# Patient Record
Sex: Female | Born: 1995 | Race: White | Hispanic: No | Marital: Single | Smoking: Never smoker
Health system: Southern US, Community
[De-identification: ages and names within clinical notes are randomized; demographics above are authoritative.]

## PROBLEM LIST (undated history)

## (undated) DIAGNOSIS — N949 Unspecified condition associated with female genital organs and menstrual cycle: Secondary | ICD-10-CM

## (undated) DIAGNOSIS — Z975 Presence of (intrauterine) contraceptive device: Secondary | ICD-10-CM

## (undated) DIAGNOSIS — M419 Scoliosis, unspecified: Secondary | ICD-10-CM

## (undated) DIAGNOSIS — N83209 Unspecified ovarian cyst, unspecified side: Secondary | ICD-10-CM

## (undated) HISTORY — PX: OTHER SURGICAL HISTORY: SHX169

## (undated) HISTORY — DX: Presence of (intrauterine) contraceptive device: Z97.5

## (undated) HISTORY — DX: Unspecified condition associated with female genital organs and menstrual cycle: N94.9

## (undated) HISTORY — DX: Unspecified ovarian cyst, unspecified side: N83.209

---
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
## 2013-03-14 ENCOUNTER — Encounter (HOSPITAL_COMMUNITY): Payer: Self-pay | Admitting: Emergency Medicine

## 2013-03-14 ENCOUNTER — Emergency Department (HOSPITAL_COMMUNITY)
Admission: EM | Admit: 2013-03-14 | Discharge: 2013-03-14 | Disposition: A | Discharge status: Home / Self Care | Payer: Medicaid Other | Attending: Emergency Medicine | Admitting: Emergency Medicine

## 2013-03-14 DIAGNOSIS — R1013 Epigastric pain: Secondary | ICD-10-CM | POA: Insufficient documentation

## 2013-03-14 DIAGNOSIS — Z8739 Personal history of other diseases of the musculoskeletal system and connective tissue: Secondary | ICD-10-CM | POA: Insufficient documentation

## 2013-03-14 DIAGNOSIS — K08109 Complete loss of teeth, unspecified cause, unspecified class: Secondary | ICD-10-CM | POA: Insufficient documentation

## 2013-03-14 DIAGNOSIS — R112 Nausea with vomiting, unspecified: Secondary | ICD-10-CM | POA: Insufficient documentation

## 2013-03-14 DIAGNOSIS — Z880 Allergy status to penicillin: Secondary | ICD-10-CM | POA: Insufficient documentation

## 2013-03-14 DIAGNOSIS — T40605A Adverse effect of unspecified narcotics, initial encounter: Secondary | ICD-10-CM | POA: Insufficient documentation

## 2013-03-14 DIAGNOSIS — K08409 Partial loss of teeth, unspecified cause, unspecified class: Secondary | ICD-10-CM

## 2013-03-14 DIAGNOSIS — R002 Palpitations: Secondary | ICD-10-CM | POA: Insufficient documentation

## 2013-03-14 DIAGNOSIS — R0602 Shortness of breath: Secondary | ICD-10-CM | POA: Insufficient documentation

## 2013-03-14 DIAGNOSIS — J029 Acute pharyngitis, unspecified: Secondary | ICD-10-CM | POA: Insufficient documentation

## 2013-03-14 LAB — HCG, SERUM, QUALITATIVE: Preg, Serum: NEGATIVE

## 2013-03-14 LAB — CBC WITH DIFFERENTIAL/PLATELET
Basophils Absolute: 0 10*3/uL (ref 0.0–0.1)
Basophils Relative: 0 % (ref 0–1)
Eosinophils Absolute: 0 10*3/uL (ref 0.0–0.7)
Eosinophils Relative: 0 % (ref 0–5)
MCH: 30.3 pg (ref 26.0–34.0)
MCHC: 34.1 g/dL (ref 30.0–36.0)
MCV: 88.8 fL (ref 78.0–100.0)
Monocytes Absolute: 0.4 10*3/uL (ref 0.1–1.0)
Platelets: 276 10*3/uL (ref 150–400)
RDW: 11.4 % — ABNORMAL LOW (ref 11.5–15.5)
WBC: 9 10*3/uL (ref 4.0–10.5)

## 2013-03-14 LAB — BASIC METABOLIC PANEL
Calcium: 9.9 mg/dL (ref 8.4–10.5)
Creatinine, Ser: 0.61 mg/dL (ref 0.50–1.10)
GFR calc non Af Amer: >90 mL/min (ref >90)
Sodium: 134 mEq/L — ABNORMAL LOW (ref 135–145)

## 2013-03-14 LAB — RAPID STREP SCREEN (MED CTR MEBANE ONLY): Streptococcus, Group A Screen (Direct): NEGATIVE

## 2013-03-14 MED ORDER — SODIUM CHLORIDE 0.9 % IV BOLUS (SEPSIS)
1000.0000 mL | Freq: Once | INTRAVENOUS | Status: AC
  Administered 2013-03-14: 1000 mL via INTRAVENOUS

## 2013-03-14 MED ORDER — ONDANSETRON HCL 4 MG/2ML IJ SOLN: 4.0000 mg | Freq: Once | INTRAMUSCULAR | Status: DC

## 2013-03-14 MED ORDER — ONDANSETRON 4 MG PO TBDP
ORAL_TABLET | ORAL | Status: AC
  Administered 2013-03-14: 4 mg via ORAL
  Filled 2013-03-14: qty 1

## 2013-03-14 MED ORDER — ONDANSETRON 8 MG PO TBDP: ORAL_TABLET | ORAL | Status: DC

## 2013-03-14 MED ORDER — ONDANSETRON 4 MG PO TBDP
4.0000 mg | ORAL_TABLET | Freq: Once | ORAL | Status: AC
  Administered 2013-03-14: 4 mg via ORAL

## 2013-03-14 NOTE — ED Provider Notes (Signed)
CSN: [id number]     Arrival date & time 03/14/13  1131 History   This chart was scribed for Geoffery Lyons, MD, by Yevette Edwards, ED Scribe. This patient was seen in room APA10/APA10 and the patient's care was started at 3:18 PM.  First MD Initiated Contact with Patient 03/14/13 1516     Chief Complaint  Patient presents with  . Emesis    The history is provided by the patient and a relative. No language interpreter was used.   HPI Comments: JADEYN HARGETT is a 17 y.o. female who presents to the Emergency Department complaining of acute, generalized abdominal pain which began yesterday along with the associated symptoms of nausea, emesis, a sore throat, and intermittent SOB and palpitations. The pt denies any diarrhea or otaligia. She was recently exposed to an individual who had strep throat. Two days ago, she had 4 wisdom teeth removed; the pt was prescribed hydrocodone and clindamycin following the operation. The pt states that she has experienced nausea after taking the hydrocodone.  She denies any abdominal surgeries. The pt has a h/o 12 surgeries for scoliosis. She is a non-smoker.   Past Medical History  Diagnosis Date  . Scoliosis    Past Surgical History  Procedure Laterality Date  . Back surgery for scoliosisi     History reviewed. No pertinent family history. History  Substance Use Topics  . Smoking status: Never Smoker   . Smokeless tobacco: Not on file  . Alcohol Use: No   No OB history provided.  Review of Systems  Constitutional: Positive for fever (Measured 99.6 F in the ED. ).  HENT: Positive for sore throat. Negative for ear pain.   Respiratory: Positive for shortness of breath.   Cardiovascular: Positive for palpitations.  Gastrointestinal: Positive for nausea, vomiting and abdominal pain. Negative for diarrhea.  All other systems reviewed and are negative.   Allergies  Morphine and related; Demerol; Fentanyl; and Penicillins  Home Medications    Current Outpatient Rx  Name  Route  Sig  Dispense  Refill  . sertraline (ZOLOFT) 100 MG tablet   Oral   Take 100 mg by mouth at bedtime.         . traZODone (DESYREL) 100 MG tablet   Oral   Take 100 mg by mouth at bedtime.          Triage Vitals: BP 113/73  Pulse 99  Temp(Src) 99.6 F (37.6 C) (Oral)  Resp 20  Ht 5\' 1"  (1.549 m)  Wt 95 lb (43.092 kg)  BMI 17.96 kg/m2  SpO2 100%  Physical Exam  Nursing note and vitals reviewed. Constitutional: She is oriented to person, place, and time. She appears well-developed and well-nourished. No distress.  HENT:  Head: Normocephalic and atraumatic.  The PO is mildly erythematous without exudate.  The extraction sites appear clean without bleeding or significant swelling.   Eyes: EOM are normal.  Neck: Neck supple. No tracheal deviation present.  Cardiovascular: Normal rate, regular rhythm and normal heart sounds.   No murmur heard. Pulmonary/Chest: Effort normal. No respiratory distress. She has no wheezes.  Abdominal: Soft. Bowel sounds are normal. She exhibits no distension. There is no tenderness. There is no rebound and no guarding.  Musculoskeletal: Normal range of motion.  Neurological: She is alert and oriented to person, place, and time.  Skin: Skin is warm and dry. No rash noted.  Psychiatric: She has a normal mood and affect. Her behavior is normal.    ED  Course  Procedures (including critical care time)  DIAGNOSTIC STUDIES: Oxygen Saturation is 100% on room air, normal by my interpretation.    COORDINATION OF CARE:  3:22 PM- Discussed treatment plan with patient, and the patient agreed to the plan.   5:46 PM- Rechecked pt. Informed pt of lab results.   Labs Review Labs Reviewed  CBC WITH DIFFERENTIAL - Abnormal; Notable for the following:    RDW 11.4 (*)    Neutrophils Relative % 80 (*)    All other components within normal limits  BASIC METABOLIC PANEL - Abnormal; Notable for the following:     Sodium 134 (*)    CO2 18 (*)    Glucose, Bld 54 (*)    All other components within normal limits  RAPID STREP SCREEN  CULTURE, GROUP A STREP  HCG, SERUM, QUALITATIVE   Imaging Review No results found.  EKG Interpretation   None       MDM  No diagnosis found. Patient is a 17 year old female who presents with complaints of nausea, vomiting, feeling shaky, and her throat hurting. She is 2 days status post wisdom tooth extraction. She was given IV fluids and nausea medication and appears more comfortable. Her laboratory studies are unremarkable. I am uncertain as to whether her symptoms are related to some sort of gastroenteritis or whether a side effect of the pain medication she was given. While in the ER, she experienced an episode where her heart was beating "forcefully". An EKG was performed and was unremarkable. She was monitored on a cardiac monitor for another hour and had no further episodes. At this point I feel as though she is stable for discharge. She is to experience further episodes she is to return to the emergency department.  I personally performed the services described in this documentation, which was scribed in my presence. The recorded information has been reviewed and is accurate.       Geoffery Lyons, MD 03/14/13 [phone]

## 2013-03-14 NOTE — ED Notes (Signed)
Had wisdom teeth removed 2 days ago. Felt sick yesterday, Today, vomiting and feels shakey, sore throat.

## 2013-03-14 NOTE — ED Notes (Signed)
Pt notified tech she felt like her heart was fluttering. EDP notified.

## 2013-03-14 NOTE — ED Notes (Signed)
Pt presents post surgical wisdom tooth extraction x 3 days ago. Pt c/o N/V past 2 days. No active vomiting noted at this time. Pt reports teeth/inscision site remains painful, and feels is nauseated due to the surgery. NAD noted at this time. No oral swelling or facial swelling noted.

## 2013-03-18 LAB — CULTURE, GROUP A STREP

## 2013-05-09 HISTORY — PX: WISDOM TOOTH EXTRACTION: SHX21

## 2014-01-01 ENCOUNTER — Encounter: Payer: Self-pay | Admitting: Adult Health

## 2014-01-01 ENCOUNTER — Ambulatory Visit (INDEPENDENT_AMBULATORY_CARE_PROVIDER_SITE_OTHER): Payer: Self-pay | Admitting: Adult Health

## 2014-01-01 VITALS — BP 106/72 | Ht 61.0 in | Wt 93.0 lb

## 2014-01-01 DIAGNOSIS — N949 Unspecified condition associated with female genital organs and menstrual cycle: Secondary | ICD-10-CM | POA: Insufficient documentation

## 2014-01-01 DIAGNOSIS — Z30430 Encounter for insertion of intrauterine contraceptive device: Secondary | ICD-10-CM | POA: Insufficient documentation

## 2014-01-01 DIAGNOSIS — N83209 Unspecified ovarian cyst, unspecified side: Secondary | ICD-10-CM | POA: Insufficient documentation

## 2014-01-01 DIAGNOSIS — Z975 Presence of (intrauterine) contraceptive device: Secondary | ICD-10-CM

## 2014-01-01 HISTORY — DX: Unspecified condition associated with female genital organs and menstrual cycle: N94.9

## 2014-01-01 HISTORY — DX: Presence of (intrauterine) contraceptive device: Z97.5

## 2014-01-01 HISTORY — DX: Unspecified ovarian cyst, unspecified side: N83.209

## 2014-01-01 NOTE — Progress Notes (Signed)
Subjective:     Patient ID: Rose Kaufman, female   DOB: 01-01-1996, 18 y.o.   MRN: [id number]  HPI Rose Kaufman is a 18 year old white female, in complaining of stabbing pain in pelvic area yesterday and some bleeding, has had some pain on and off before.Has had IUD since July 2013.Not currently having sex.  Review of Systems See HPI Reviewed past medical,surgical, social and family history. Reviewed medications and allergies.     Objective:   Physical Exam BP 106/72  Ht  (1.549 m)  Wt 93 lb (42.185 kg)  BMI 17.58 kg/m2 Skin warm and dry.Pelvic: external genitalia is normal in appearance, vagina: tan discharge without odor, cervix:smooth, +IUD string, uterus: normal size, shape and contour, non tender, no masses felt, adnexa: no masses or tenderness noted. Did [place] check IUD in place, has left ovarian cyst, discussed these can come and go.    Assessment:     Left ovarian cyst IUD in place Pelvic pain    Plan:     Take advil for pain  Increase fluids Pap at 21  Follow up prn Review handout on ovarian cyst

## 2014-01-01 NOTE — Patient Instructions (Signed)
Ovarian Cyst An ovarian cyst is a fluid-filled sac that forms on an ovary. The ovaries are small organs that produce eggs in women. Various types of cysts can form on the ovaries. Most are not cancerous. Many do not cause problems, and they often go away on their own. Some may cause symptoms and require treatment. Common types of ovarian cysts include:  Functional cysts--These cysts may occur every month during the menstrual cycle. This is normal. The cysts usually go away with the next menstrual cycle if the woman does not get pregnant. Usually, there are no symptoms with a functional cyst.  Endometrioma cysts--These cysts form from the tissue that lines the uterus. They are also called "chocolate cysts" because they become filled with blood that turns brown. This type of cyst can cause pain in the lower abdomen during intercourse and with your menstrual period.  Cystadenoma cysts--This type develops from the cells on the outside of the ovary. These cysts can get very big and cause lower abdomen pain and pain with intercourse. This type of cyst can twist on itself, cut off its blood supply, and cause severe pain. It can also easily rupture and cause a lot of pain.  Dermoid cysts--This type of cyst is sometimes found in both ovaries. These cysts may contain different kinds of body tissue, such as skin, teeth, hair, or cartilage. They usually do not cause symptoms unless they get very big.  Theca lutein cysts--These cysts occur when too much of a certain hormone (human chorionic gonadotropin) is produced and overstimulates the ovaries to produce an egg. This is most common after procedures used to assist with the conception of a baby (in vitro fertilization). CAUSES   Fertility drugs can cause a condition in which multiple large cysts are formed on the ovaries. This is called ovarian hyperstimulation syndrome.  A condition called polycystic ovary syndrome can cause hormonal imbalances that can lead to  nonfunctional ovarian cysts. SIGNS AND SYMPTOMS  Many ovarian cysts do not cause symptoms. If symptoms are present, they may include:  Pelvic pain or pressure.  Pain in the lower abdomen.  Pain during sexual intercourse.  Increasing girth (swelling) of the abdomen.  Abnormal menstrual periods.  Increasing pain with menstrual periods.  Stopping having menstrual periods without being pregnant. DIAGNOSIS  These cysts are commonly found during a routine or annual pelvic exam. Tests may be ordered to find out more about the cyst. These tests may include:  Ultrasound.  X-ray of the pelvis.  CT scan.  MRI.  Blood tests. TREATMENT  Many ovarian cysts go away on their own without treatment. Your health care provider may want to check your cyst regularly for 2-3 months to see if it changes. For women in menopause, it is particularly important to monitor a cyst closely because of the higher rate of ovarian cancer in menopausal women. When treatment is needed, it may include any of the following:  A procedure to drain the cyst (aspiration). This may be done using a long needle and ultrasound. It can also be done through a laparoscopic procedure. This involves using a thin, lighted tube with a tiny camera on the end (laparoscope) inserted through a small incision.  Surgery to remove the whole cyst. This may be done using laparoscopic surgery or an open surgery involving a larger incision in the lower abdomen.  Hormone treatment or birth control pills. These methods are sometimes used to help dissolve a cyst. HOME CARE INSTRUCTIONS   Only take over-the-counter   or prescription medicines as directed by your health care provider.  Follow up with your health care provider as directed.  Get regular pelvic exams and Pap tests. SEEK MEDICAL CARE IF:   Your periods are late, irregular, or painful, or they stop.  Your pelvic pain or abdominal pain does not go away.  Your abdomen becomes  larger or swollen.  You have pressure on your bladder or trouble emptying your bladder completely.  You have pain during sexual intercourse.  You have feelings of fullness, pressure, or discomfort in your stomach.  You lose weight for no apparent reason.  You feel generally ill.  You become constipated.  You lose your appetite.  You develop acne.  You have an increase in body and facial hair.  You are gaining weight, without changing your exercise and eating habits.  You think you are pregnant. SEEK IMMEDIATE MEDICAL CARE IF:   You have increasing abdominal pain.  You feel sick to your stomach (nauseous), and you throw up (vomit).  You develop a fever that comes on suddenly.  You have abdominal pain during a bowel movement.  Your menstrual periods become heavier than usual. MAKE SURE YOU:  Understand these instructions.  Will watch your condition.  Will get help right away if you are not doing well or get worse. Document Released: 04/25/2005 Document Revised: 04/30/2013 Document Reviewed: 12/31/2012 [facility] Patient Information 2015 [place], [place]. This information is not intended to replace advice given to you by your health care provider. Make sure you discuss any questions you have with your health care provider. Pap at 21 Take advil an increase fluids

## 2014-06-05 ENCOUNTER — Encounter: Payer: Self-pay | Admitting: Nurse Practitioner

## 2014-06-05 ENCOUNTER — Ambulatory Visit (INDEPENDENT_AMBULATORY_CARE_PROVIDER_SITE_OTHER): Payer: 59 | Admitting: Nurse Practitioner

## 2014-06-05 VITALS — BP 98/62 | Ht 61.0 in | Wt 91.8 lb

## 2014-06-05 DIAGNOSIS — L509 Urticaria, unspecified: Secondary | ICD-10-CM

## 2014-06-05 MED ORDER — TRIAMCINOLONE ACETONIDE 0.1 % EX CREA: 1.0000 "application " | TOPICAL_CREAM | Freq: Two times a day (BID) | CUTANEOUS | Status: DC

## 2014-06-05 NOTE — Patient Instructions (Signed)
Loratadine 10 mg once in the morning Benadryl 25 mg at bedtime Ranitidine (Zantac) 75 once a day

## 2014-06-08 ENCOUNTER — Encounter: Payer: Self-pay | Admitting: Nurse Practitioner

## 2014-06-08 NOTE — Progress Notes (Signed)
Subjective:  Presents for c/o recurring rash only on the chest area. Began months ago after going to the beach in [place]. Localized to the sternal area. Slight burning. Severe itching. Will resolve without intervention within a few days. No known allergens. No known contacts. Has not identified any triggers. Has been under more stress. No wheezing or trouble swallowing.   Objective:   BP 98/62 mmHg  Ht 5\' 1"  (1.549 m)  Wt 91 lb 12.8 oz (41.64 kg)  BMI 17.35 kg/m2 NAD. Alert, oriented. Lungs clear. Heart RRR. Raised moderate erythematous hives localized to mid chest area.   Assessment: Urticaria  Plan:  Meds ordered this encounter  Medications  . triamcinolone cream (KENALOG) 0.1 %    Sig: Apply 1 application topically 2 (two) times daily. Prn rash; use up to 2 weeks    Dispense:  30 g    Refill:  0    Order Specific Question:  Supervising Provider    Answer:  Merlyn AlbertLUKING, WILLIAM S [[id number]]   Loratadine in am; benadryl 25 in pm; zantac as directed. Steroid cream as directed. Warning signs reviewed. Call back if worsens or persists.

## 2014-07-30 ENCOUNTER — Ambulatory Visit (INDEPENDENT_AMBULATORY_CARE_PROVIDER_SITE_OTHER): Payer: 59 | Admitting: Advanced Practice Midwife

## 2014-07-30 ENCOUNTER — Encounter: Payer: Self-pay | Admitting: Advanced Practice Midwife

## 2014-07-30 VITALS — BP 90/68 | HR 68 | Ht 61.2 in | Wt 90.0 lb

## 2014-07-30 DIAGNOSIS — Z975 Presence of (intrauterine) contraceptive device: Secondary | ICD-10-CM

## 2014-07-30 DIAGNOSIS — Z30431 Encounter for routine checking of intrauterine contraceptive device: Secondary | ICD-10-CM

## 2014-07-30 NOTE — Progress Notes (Signed)
[facility] Visit  Patient name: Rose Kaufman MRN [id number]  Date of birth: February 09, 1996  CC & HPI:  Rose Kaufman is a 19 y.o.  G0P0 Caucasian female presenting today for a check up on her Mirena IUD.  She had it placed July 2013.  Has semi-regular spotting, never heavy bleeding.  Had always been able to find her strings, but now can not.  She last felt them a few weeks ago.  Also, had 3 days of burning with urination, responded without any treatment.   Pertinent History Reviewed:  Medical & Surgical Hx:   Past Medical History  Diagnosis Date  . Scoliosis   . IUD (intrauterine device) in place 01/01/2014    Had inserted July 2013  . Unspecified symptom associated with female genital organs 01/01/2014  . Other and unspecified ovarian cyst 01/01/2014    Has left ovarian cyst,3.56 x 3.01 cm   Past Surgical History  Procedure Laterality Date  . Back surgery for scoliosisi    . Wisdom tooth extraction  2015   Family History  Problem Relation Age of Onset  . Bipolar disorder Father     Current outpatient prescriptions:  .  levonorgestrel (MIRENA) 20 MCG/24HR IUD, 1 each by Intrauterine route once., Disp: , Rfl:  .  triamcinolone cream (KENALOG) 0.1 %, Apply 1 application topically 2 (two) times daily. Prn rash; use up to 2 weeks (Patient not taking: Reported on 07/30/2014), Disp: 30 g, Rfl: 0 Social History: Reviewed -  reports that she has never smoked. She does not have any smokeless tobacco history on file.  Review of Systems:   Constitutional: Negative for fever and chills Eyes: Negative for visual disturbances Respiratory: Negative for shortness of breath, dyspnea Cardiovascular: Negative for chest pain or palpitations  Gastrointestinal: Negative for vomiting, diarrhea and constipation; no abdominal pain Genitourinary: Negative for dysuria and urgency, vaginal irritation or itching Musculoskeletal: Negative for back pain, joint pain, myalgias  Neurological:  Negative for dizziness and headaches    Objective Findings:  Vitals: BP 90/68 mmHg  Pulse 68  Ht 5' 1.2" (1.554 m)  Wt 90 lb (40.824 kg)  BMI 16.90 kg/m2  Physical Examination:  General appearance - alert, well appearing, and in no distress Mental status -  oriented to person, place, and time Abdomen - soft, nontender, nondistended, no masses or organomegaly Pelvic - SSE:  Normal appearing dc.  Cx non friable.  Strings not visible or palpable.  Beside US shows normally placed IUD. Urine:  Negative   ASSESSMENT:  Normally placed IUD, strings most likely in cervix.  Pt warned that if menstrual pattern changes (heavier, more regular bleeding), to have IUD placement checked.  Likelihood of it falling out of a nulliparous pt after 3 years is low.  CRESENZO-DISHMAN,Heliodoro Domagalski CNM 07/30/2014 3:49 PM

## 2015-02-20 ENCOUNTER — Encounter: Payer: Self-pay | Admitting: Family Medicine

## 2015-02-20 ENCOUNTER — Encounter: Payer: Self-pay | Admitting: Nurse Practitioner

## 2015-02-20 ENCOUNTER — Ambulatory Visit (INDEPENDENT_AMBULATORY_CARE_PROVIDER_SITE_OTHER): Payer: 59 | Admitting: Nurse Practitioner

## 2015-02-20 VITALS — BP 116/70 | Ht 61.0 in | Wt 100.4 lb

## 2015-02-20 DIAGNOSIS — F418 Other specified anxiety disorders: Secondary | ICD-10-CM | POA: Diagnosis not present

## 2015-02-20 DIAGNOSIS — F419 Anxiety disorder, unspecified: Principal | ICD-10-CM

## 2015-02-20 DIAGNOSIS — F32A Depression, unspecified: Secondary | ICD-10-CM

## 2015-02-20 DIAGNOSIS — F329 Major depressive disorder, single episode, unspecified: Secondary | ICD-10-CM

## 2015-02-20 MED ORDER — CLONAZEPAM 0.5 MG PO TABS: ORAL_TABLET | ORAL | Status: DC

## 2015-02-20 NOTE — Progress Notes (Signed)
Subjective:  Presents for complaints of a flareup of her anxiety. About 3-4 years ago patient was hospitalized for which she calls manic depressive disorder. Was on Zoloft and trazodone at that time, dosage was increased with no improvement of her symptoms and significant side effects. Was on other medicine such as Lexapro and Rozerem. Has done well lately until the past 6 months. Between work and school patient is busy 7 days per week. Complaints of fatigue emotional lability, anger, early morning awakenings, social isolation, short-term memory loss and difficulty concentrating. Has a fianc that is in [place] going to school. Has very few friends. Has moved in with her grandmother and mother he did not really understand her mental health issues. [facility] her father has manic depressive disorder. Mild OCD symptoms. Denies suicidal or homicidal thoughts or ideation. Does have a history of self-mutilation, has been tempted to restart this but has not at this point. Having occasional panic attacks where she feels it's hard to breathe and a smothering sensation. Does not occur every day.  Objective:   BP 116/70 mmHg  Ht 5\' 1"  (1.549 m)  Wt 100 lb 6 oz (45.53 kg)  BMI 18.98 kg/m2 NAD. Alert, oriented. Thoughts logical coherent and relevant. Dressed appropriately. Multiple fine scars noted on both forearms from previous self-mutilation. Lungs clear. Heart regular rate rhythm.  Assessment: Anxiety and depression/manic depressive disorder per history  Plan:  Meds ordered this encounter  Medications  . clonazePAM (KLONOPIN) 0.5 MG tablet    Sig: 1/2-1 po BID prn panic attacks    Dispense:  20 tablet    Refill:  0    Order Specific Question:  Supervising Provider    Answer:  Merlyn AlbertLUKING, WILLIAM S [[id number]]   Limited use of Klonopin only for panic attacks. Due to the complexity of her mental health history, patient [place] to [place]. Patient to seek  help immediately if any suicidal or homicidal thoughts or ideation.

## 2016-01-26 ENCOUNTER — Telehealth: Payer: Self-pay | Admitting: Nurse Practitioner

## 2016-01-26 NOTE — Telephone Encounter (Signed)
error 

## 2016-02-01 ENCOUNTER — Ambulatory Visit (INDEPENDENT_AMBULATORY_CARE_PROVIDER_SITE_OTHER): Payer: 59 | Admitting: Nurse Practitioner

## 2016-02-01 ENCOUNTER — Encounter: Payer: Self-pay | Admitting: Nurse Practitioner

## 2016-02-01 VITALS — BP 110/70 | Ht 61.0 in | Wt 103.0 lb

## 2016-02-01 DIAGNOSIS — F418 Other specified anxiety disorders: Secondary | ICD-10-CM | POA: Diagnosis not present

## 2016-02-01 DIAGNOSIS — F419 Anxiety disorder, unspecified: Principal | ICD-10-CM

## 2016-02-01 DIAGNOSIS — F329 Major depressive disorder, single episode, unspecified: Secondary | ICD-10-CM

## 2016-02-01 DIAGNOSIS — F32A Depression, unspecified: Secondary | ICD-10-CM

## 2016-02-01 MED ORDER — ALPRAZOLAM 0.5 MG PO TABS: 0.5000 mg | ORAL_TABLET | Freq: Two times a day (BID) | ORAL | 0 refills | Status: DC | PRN

## 2016-02-01 MED ORDER — CITALOPRAM HYDROBROMIDE 20 MG PO TABS: ORAL_TABLET | ORAL | 2 refills | Status: DC

## 2016-02-01 NOTE — Progress Notes (Signed)
Subjective:  Presents for recheck on anxiety and depression. Was being seen at the [facility] in [place]. Was being seen by her psychiatrist who is now left for several months. They insisted that she see a therapist weekly if she was being treated there, states she cannot afford the $25 per week fee for therapy. Has tried multiple medications to help her anxiety and depression most recently JordanLatuda and Viibryd which caused major side effects. Took Zoloft several years ago, remembers no side effects but did not seem to help her. Was told by her last psychiatrist that she does not have bipolar disorder. Some difficulty sleeping. Takes occasional Xanax 0.5 mg, is given 60 per month, tries to avoid taking it unless she needs to. Denies any suicidal thoughts or ideation.  Objective:   BP 110/70   Ht 5\' 1"  (1.549 m)   Wt 103 lb (46.7 kg)   BMI 19.46 kg/m  NAD. Alert, oriented. Lungs clear. Heart regular rate rhythm. Thoughts logical coherent and relevant. Calm affect. Dressed appropriately. Good eye contact.  Assessment:  Problem List Items Addressed This Visit      Other   Anxiety and depression - Primary    Other Visit Diagnoses   None.    Plan: Meds ordered this encounter  Medications  . DISCONTD: ALPRAZolam (XANAX) 0.5 MG tablet    Sig: Take 0.5 mg by mouth 2 (two) times daily as needed for anxiety.  . ALPRAZolam (XANAX) 0.5 MG tablet    Sig: Take 1 tablet (0.5 mg total) by mouth 2 (two) times daily as needed for anxiety.    Dispense:  60 tablet    Refill:  0    Order Specific Question:   Supervising Provider    Answer:   Merlyn AlbertLUKING, WILLIAM S [[id number]]  . citalopram (CELEXA) 20 MG tablet    Sig: 1/2 tab po qhs x 6 d then one po qhs    Dispense:  30 tablet    Refill:  2    Order Specific Question:   Supervising Provider    Answer:   Merlyn AlbertLUKING, WILLIAM S [[id number]]   Discussed options at length. Trial of citalopram starting with half tab by mouth daily at bedtime with plans to increase to  one by mouth daily at bedtime. Given another prescription for alprazolam, use very sparingly. DC Celexa and call if any problems. Reviewed potential adverse effects. If persistent symptoms or significant reactions to medications that we try, recommend referral back to mental health for further evaluation. Return in about 1 month (around 03/02/2016) for recheck. Call back sooner if needed.

## 2016-03-02 ENCOUNTER — Encounter: Payer: Self-pay | Admitting: Nurse Practitioner

## 2016-03-02 ENCOUNTER — Ambulatory Visit (INDEPENDENT_AMBULATORY_CARE_PROVIDER_SITE_OTHER): Payer: 59 | Admitting: Nurse Practitioner

## 2016-03-02 VITALS — BP 92/64 | Ht 61.0 in | Wt 102.4 lb

## 2016-03-02 DIAGNOSIS — F329 Major depressive disorder, single episode, unspecified: Secondary | ICD-10-CM

## 2016-03-02 DIAGNOSIS — F418 Other specified anxiety disorders: Secondary | ICD-10-CM | POA: Diagnosis not present

## 2016-03-02 DIAGNOSIS — F32A Depression, unspecified: Secondary | ICD-10-CM

## 2016-03-02 DIAGNOSIS — F419 Anxiety disorder, unspecified: Principal | ICD-10-CM

## 2016-03-02 MED ORDER — ALPRAZOLAM 0.5 MG PO TABS: 0.5000 mg | ORAL_TABLET | Freq: Two times a day (BID) | ORAL | 2 refills | Status: DC | PRN

## 2016-03-03 ENCOUNTER — Encounter: Payer: Self-pay | Admitting: Nurse Practitioner

## 2016-03-03 NOTE — Progress Notes (Signed)
Subjective:  Presents for recheck, see previous note. Doing well on Celexa 20 mg. Less irritable. Sleeping better. Taking Xanax twice a day. The combination of Xanax and Celexa make her feel a little foggy but this is at bedtime and is relieved with rest, no problem the next morning. No drowsiness when taking Xanax by itself, no difficulty performing daily activities.  Objective:   BP 92/64   Ht 5\' 1"  (1.549 m)   Wt 102 lb 6 oz (46.4 kg)   BMI 19.34 kg/m  NAD. Alert, oriented. Lungs clear. Heart regular rate rhythm. Thoughts logical coherent and relevant. Dressed appropriately. Cheerful affect. Makes good eye contact.  Assessment:  Problem List Items Addressed This Visit      Other   Anxiety and depression - Primary    Other Visit Diagnoses   None.    Plan:  Meds ordered this encounter  Medications  . ALPRAZolam (XANAX) 0.5 MG tablet    Sig: Take 1 tablet (0.5 mg total) by mouth 2 (two) times daily as needed for anxiety.    Dispense:  60 tablet    Refill:  2    Order Specific Question:   Supervising Provider    Answer:   Merlyn AlbertLUKING, WILLIAM S [[id number]]   Continue current medications as directed. Return in about 3 months (around 06/02/2016) for recheck. Call back sooner if any problems or needs increase in Celexa dose.

## 2016-05-07 ENCOUNTER — Other Ambulatory Visit: Payer: Self-pay | Admitting: Nurse Practitioner

## 2016-05-11 ENCOUNTER — Telehealth: Payer: Self-pay | Admitting: Nurse Practitioner

## 2016-05-11 MED ORDER — CITALOPRAM HYDROBROMIDE 20 MG PO TABS: ORAL_TABLET | ORAL | 0 refills | Status: DC

## 2016-05-11 NOTE — Telephone Encounter (Signed)
Patient requesting refill on her Celexa.  She says she is completely out.  She has a follow up appointment with Eber Jonesarolyn on 05/30/16.  [facility] [place]

## 2016-05-11 NOTE — Telephone Encounter (Signed)
Med sent to pharmacy. Patient verbalized understanding. 

## 2016-05-30 ENCOUNTER — Ambulatory Visit (INDEPENDENT_AMBULATORY_CARE_PROVIDER_SITE_OTHER): Payer: 59 | Admitting: Nurse Practitioner

## 2016-05-30 VITALS — BP 98/66 | Ht 61.0 in | Wt 97.6 lb

## 2016-05-30 DIAGNOSIS — F329 Major depressive disorder, single episode, unspecified: Secondary | ICD-10-CM

## 2016-05-30 DIAGNOSIS — F418 Other specified anxiety disorders: Secondary | ICD-10-CM | POA: Diagnosis not present

## 2016-05-30 DIAGNOSIS — F419 Anxiety disorder, unspecified: Principal | ICD-10-CM

## 2016-05-30 DIAGNOSIS — F32A Depression, unspecified: Secondary | ICD-10-CM

## 2016-05-30 MED ORDER — CITALOPRAM HYDROBROMIDE 20 MG PO TABS: ORAL_TABLET | ORAL | 5 refills | Status: DC

## 2016-05-31 ENCOUNTER — Encounter: Payer: Self-pay | Admitting: Nurse Practitioner

## 2016-05-31 NOTE — Progress Notes (Signed)
Subjective:  Presents for recheck of her anxiety and depression. Taking 1-2 Xanax at nighttime. Rarely during the day. Overall symptoms have improved would like to increase her dose of Celexa slightly.  Objective:   BP 98/66   Ht 5\' 1"  (1.549 m)   Wt 97 lb 9.6 oz (44.3 kg)   BMI 18.44 kg/m  NAD. Alert, oriented. Lungs clear. Heart regular rate rhythm. Thoughts logical coherent and relevant. Dressed appropriately. Cheerful affect.  Assessment:   Problem List Items Addressed This Visit      Other   Anxiety and depression - Primary       Plan:  Meds ordered this encounter  Medications  . citalopram (CELEXA) 20 MG tablet    Sig: TAKE 1 1/2 TABLET AT BEDTIME.    Dispense:  45 tablet    Refill:  5    Order Specific Question:   Supervising Provider    Answer:   Merlyn AlbertLUKING, WILLIAM S [[id number]]   Increase Celexa to 30 mg qd. Go back to previous dose and call if any problems. Call back in one month if no improvement.  Return in about 4 months (around 09/27/2016) for recheck.

## 2016-07-04 ENCOUNTER — Encounter: Payer: Self-pay | Admitting: Nurse Practitioner

## 2016-07-05 ENCOUNTER — Telehealth: Payer: Self-pay | Admitting: Nurse Practitioner

## 2016-07-05 ENCOUNTER — Other Ambulatory Visit: Payer: Self-pay | Admitting: Nurse Practitioner

## 2016-07-05 MED ORDER — ALPRAZOLAM 0.5 MG PO TABS: 0.5000 mg | ORAL_TABLET | Freq: Two times a day (BID) | ORAL | 2 refills | Status: DC | PRN

## 2016-07-05 NOTE — Telephone Encounter (Signed)
I just printed a Xanax Rx for this patient but not back in the office until Thursday. Can you get one of the doctors to sign for me? Thanks.

## 2016-07-05 NOTE — Telephone Encounter (Signed)
Called patient to find out name of pharmacy. Patient stated she already had refills at the pharmacy that she didn't know she had and doesn't need the prescription at this time

## 2016-09-26 ENCOUNTER — Ambulatory Visit (INDEPENDENT_AMBULATORY_CARE_PROVIDER_SITE_OTHER): Payer: Self-pay | Admitting: Nurse Practitioner

## 2016-09-26 ENCOUNTER — Encounter: Payer: Self-pay | Admitting: Nurse Practitioner

## 2016-09-26 VITALS — BP 98/68 | Ht 61.0 in | Wt 95.2 lb

## 2016-09-26 DIAGNOSIS — F329 Major depressive disorder, single episode, unspecified: Secondary | ICD-10-CM

## 2016-09-26 DIAGNOSIS — F419 Anxiety disorder, unspecified: Secondary | ICD-10-CM

## 2016-09-26 MED ORDER — CITALOPRAM HYDROBROMIDE 20 MG PO TABS: ORAL_TABLET | ORAL | 5 refills | Status: DC

## 2016-09-27 ENCOUNTER — Encounter: Payer: Self-pay | Admitting: Nurse Practitioner

## 2016-09-27 NOTE — Progress Notes (Signed)
Subjective:  Presents for recheck on anxiety and depression. Doing well on Celexa. Takes a rare Xanax. Still some issues with sleep but doing better.   Objective:   BP 98/68   Ht 5\' 1"  (1.549 m)   Wt 95 lb 3.2 oz (43.2 kg)   BMI 17.99 kg/m  NAD. Alert, oriented. Lungs clear. Heart RRR.   Assessment:   Problem List Items Addressed This Visit      Other   Anxiety and depression - Primary       Plan:   Meds ordered this encounter  Medications  . citalopram (CELEXA) 20 MG tablet    Sig: TAKE 1 1/2 TABLET AT BEDTIME.    Dispense:  45 tablet    Refill:  5    Order Specific Question:   Supervising Provider    Answer:   Merlyn AlbertLUKING, WILLIAM S [[id number]]   Return in about 6 months (around 03/29/2017) for recheck. Call back sooner if any problems.

## 2016-12-05 ENCOUNTER — Telehealth: Payer: Self-pay | Admitting: Nurse Practitioner

## 2016-12-05 NOTE — Telephone Encounter (Signed)
Left message to return call 

## 2016-12-05 NOTE — Telephone Encounter (Signed)
Patient said that she has gone 4 days without taking her citalopram due to money issues.  She said she is going to pick this up today and restart.  She wants to know if she should take this medication as soon as she picks this up or take it at her normal time?

## 2016-12-05 NOTE — Telephone Encounter (Signed)
At this point take first dose as soon as she gets it; then go back to normal time tomorrow. She may experience some issues with nausea, agitation and sleep for a few days since missing these doses.

## 2016-12-05 NOTE — Telephone Encounter (Signed)
Patient advised per Rose Kaufman  at this point take first dose as soon as she gets it; then go back to normal time tomorrow. She may experience some issues with nausea, agitation and sleep for a few days since missing these doses. Patient verbalized understanding.

## 2016-12-10 ENCOUNTER — Other Ambulatory Visit: Payer: Self-pay | Admitting: Nurse Practitioner

## 2017-01-23 ENCOUNTER — Ambulatory Visit (INDEPENDENT_AMBULATORY_CARE_PROVIDER_SITE_OTHER): Payer: Self-pay | Admitting: Nurse Practitioner

## 2017-01-23 ENCOUNTER — Encounter: Payer: Self-pay | Admitting: Nurse Practitioner

## 2017-01-23 VITALS — BP 110/68 | Ht 61.0 in | Wt 101.0 lb

## 2017-01-23 DIAGNOSIS — M419 Scoliosis, unspecified: Secondary | ICD-10-CM | POA: Insufficient documentation

## 2017-01-23 DIAGNOSIS — F419 Anxiety disorder, unspecified: Secondary | ICD-10-CM

## 2017-01-23 DIAGNOSIS — F329 Major depressive disorder, single episode, unspecified: Secondary | ICD-10-CM

## 2017-01-23 DIAGNOSIS — F32A Depression, unspecified: Secondary | ICD-10-CM

## 2017-01-23 MED ORDER — CITALOPRAM HYDROBROMIDE 20 MG PO TABS: ORAL_TABLET | ORAL | 5 refills | Status: DC

## 2017-01-23 MED ORDER — ALPRAZOLAM 0.5 MG PO TABS: ORAL_TABLET | ORAL | 0 refills | Status: DC

## 2017-01-24 ENCOUNTER — Encounter: Payer: Self-pay | Admitting: Nurse Practitioner

## 2017-01-24 NOTE — Progress Notes (Signed)
Subjective:  Presents for recheck on her anxiety and depression. Was doing well on Celexa ran out of her medication. Had some significant issues with serotonin withdrawal including feeling hot and sleep difficulty which is still an issue. Has been back on her medicine for only 2 days. Was doing well on Celexa before she ran out of medication. When she takes Xanax at nighttime she does sleep all night. Tries to avoid taking this every day. Has Mirena for birth control. Depression screen PHQ 2/9 01/23/2017  Decreased Interest 1  Down, Depressed, Hopeless 1  PHQ - 2 Score 2  Altered sleeping 3  Tired, decreased energy 1  Change in appetite 0  Feeling bad or failure about yourself  2  Trouble concentrating 3  Moving slowly or fidgety/restless 0  Suicidal thoughts 0  PHQ-9 Score 11  Difficult doing work/chores Somewhat difficult   GAD 7 : Generalized Anxiety Score 01/23/2017  Nervous, Anxious, on Edge 0  Control/stop worrying 1  Worry too much - different things 1  Trouble relaxing 3  Restless 1  Easily annoyed or irritable 3  Afraid - awful might happen 0  Total GAD 7 Score 9  Anxiety Difficulty Somewhat difficult    Note these scores are after she only been back on medicine for 2 days after running out.    Objective:   BP 110/68   Ht  (1.549 m)   Wt 101 lb (45.8 kg)   BMI 19.08 kg/m  NAD. Alert, oriented. Lungs clear. Heart regular rate rhythm. Thoughts logical coherent and relevant. Dressed appropriately. Cheerful affect. Making good eye contact.  Assessment:   Problem List Items Addressed This Visit      Other   Anxiety and depression - Primary       Plan:   Meds ordered this encounter  Medications  . citalopram (CELEXA) 20 MG tablet    Sig: TAKE 1 1/2 TABLET AT BEDTIME.    Dispense:  45 tablet    Refill:  5    Order Specific Question:   Supervising Provider    Answer:   Merlyn Albert [[id number]]  . ALPRAZolam (XANAX) 0.5 MG tablet    Sig: take 1 tablet by  mouth twice a day if needed    Dispense:  60 tablet    Refill:  0    [facility] [place]    Order Specific Question:   Supervising Provider    Answer:   Merlyn Albert [[id number]]   Continue current meds as directed. Advised patient to avoid abrupt discontinuance of Celexa in the future. Call back in a couple weeks if sleep issues persist. Continue to use Xanax sparingly only for extreme anxiety or sleep.  Return in about 3 months (around 04/24/2017) for recheck. Call back sooner if any problems.

## 2017-03-27 ENCOUNTER — Ambulatory Visit: Payer: Self-pay | Admitting: Nurse Practitioner

## 2017-04-24 ENCOUNTER — Ambulatory Visit: Payer: Self-pay | Admitting: Nurse Practitioner

## 2019-03-13 ENCOUNTER — Other Ambulatory Visit: Payer: Self-pay

## 2019-03-13 DIAGNOSIS — Z20822 Contact with and (suspected) exposure to covid-19: Secondary | ICD-10-CM

## 2019-03-14 LAB — NOVEL CORONAVIRUS, NAA: SARS-CoV-2, NAA: NOT DETECTED

## 2019-03-21 ENCOUNTER — Telehealth: Payer: Self-pay | Admitting: Family Medicine

## 2019-03-21 NOTE — Telephone Encounter (Signed)
Got bitten by a ferret 2 days ago.  Sore and a little swollen.  Doesn't have insurance so doesn't want to come in but wants to know what to watch for.

## 2019-03-21 NOTE — Telephone Encounter (Signed)
Left message to return call 

## 2019-03-21 NOTE — Telephone Encounter (Signed)
If animal is not up-to-date on rabies vaccine the patient should go to the ER  If it is looking infected the patient ought to also go to the urgent care or ER or be seen  If it is just slightly sore should watch for any signs of redness or swelling or pus drainage But once again it broken skin such as puncture or cut should be seen

## 2019-03-21 NOTE — Telephone Encounter (Signed)
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 The animal is under a 10 day rabies hold per their protocol and is currently showing no signs of rabies. They are monitoring to follow protocol if it develops signs of rabies. The bite did puncture the skin and the patient scheduled office visit in the am for evaluation.

## 2019-03-22 ENCOUNTER — Ambulatory Visit: Payer: Self-pay | Admitting: Family Medicine

## 2019-06-24 ENCOUNTER — Ambulatory Visit (INDEPENDENT_AMBULATORY_CARE_PROVIDER_SITE_OTHER): Payer: Self-pay | Admitting: Family Medicine

## 2019-06-24 ENCOUNTER — Other Ambulatory Visit: Payer: Self-pay

## 2019-06-24 VITALS — Temp 97.9°F | Wt 101.0 lb

## 2019-06-24 DIAGNOSIS — M792 Neuralgia and neuritis, unspecified: Secondary | ICD-10-CM

## 2019-06-24 MED ORDER — CEPHALEXIN 500 MG PO CAPS: 500.0000 mg | ORAL_CAPSULE | Freq: Four times a day (QID) | ORAL | 0 refills | Status: DC

## 2019-06-24 MED ORDER — MUPIROCIN 2 % EX OINT: TOPICAL_OINTMENT | CUTANEOUS | 0 refills | Status: DC

## 2019-06-24 NOTE — Progress Notes (Addendum)
   Subjective:    Patient ID: Rose Kaufman, female    DOB: 22-May-1995, 24 y.o.   MRN: [id number]  HPI Patient arrives with left upper arm pain s/p mole removal thru dermatology. Patient states the area is painful and sensitive well above and below the area and at times she cant lift her arm.     Review of Systems Relates a lot of arm pain around where the biopsy removal was relates sensitivity to the skin with burning and tingling and painful to the touch also relates a lot of pain and discomfort with movement of the arm relates some drainage from the area denies any high fever chills sweats    Objective:   Physical Exam There is increased sensitivity around the area where this mole was removed it appears to have good base without any obvious redness but there is increased skin sensitivity to the light touch causes pain and discomfort Patient can raise her arm but it increases the pain in this area when she does so She has been coached to do range of motion exercises with the left May do light duty over the next 7 to 10 days      Assessment & Plan:  Neuralgia associated with the removal of the mole Increased skin sensitivity around that area I doubt infection but we will do a short course of antibiotics Also Bactroban ointment If persistent symptoms or worse follow-up May need gabapentin if symptoms persist follow-up in 3 weeks

## 2019-07-02 ENCOUNTER — Encounter: Payer: Self-pay | Admitting: Family Medicine

## 2019-07-10 ENCOUNTER — Encounter: Payer: Self-pay | Admitting: Family Medicine

## 2019-07-19 ENCOUNTER — Ambulatory Visit: Payer: Self-pay | Admitting: Family Medicine

## 2019-08-22 ENCOUNTER — Other Ambulatory Visit: Payer: Self-pay

## 2019-08-22 ENCOUNTER — Ambulatory Visit
Admission: EM | Admit: 2019-08-22 | Discharge: 2019-08-22 | Disposition: A | Discharge status: Home / Self Care | Payer: Self-pay | Attending: Emergency Medicine | Admitting: Emergency Medicine

## 2019-08-22 DIAGNOSIS — G43009 Migraine without aura, not intractable, without status migrainosus: Secondary | ICD-10-CM

## 2019-08-22 MED ORDER — IBUPROFEN 800 MG PO TABS: 800.0000 mg | ORAL_TABLET | Freq: Three times a day (TID) | ORAL | 0 refills | Status: DC

## 2019-08-22 MED ORDER — SUMATRIPTAN SUCCINATE 6 MG/0.5ML ~~LOC~~ SOLN
6.0000 mg | Freq: Once | SUBCUTANEOUS | Status: AC
  Administered 2019-08-22: 12:00:00 6 mg via SUBCUTANEOUS

## 2019-08-22 MED ORDER — ONDANSETRON 4 MG PO TBDP
4.0000 mg | ORAL_TABLET | Freq: Once | ORAL | Status: AC
  Administered 2019-08-22: 12:00:00 4 mg via ORAL

## 2019-08-22 NOTE — ED Provider Notes (Signed)
[facility]    CSN: [id number] Arrival date & time: 08/22/19  1059      History   Chief Complaint Chief Complaint  Patient presents with  . Headache    HPI Rose Kaufman is a 24 y.o. female.   With history of migraine presented to the to the urgent care with a complaint of migraine headache and some dizziness for the past 3 days.  She localizes her pain to the generalized head and neck.  She describes the pain as constant and throbbing in character.  Has tried Aleve with mild relief.  Her symptoms are made worse with light.  She reports similar symptoms in the past that improved with treatment.  She does not described this as the worst headache of her life.  She denies fever, chills, nausea, vomiting, aura, rhinorrhea, watery eyes, chest pain, SOB, abdominal pain, weakness, numbness or tingling.    The history is provided by the patient. No language interpreter was used.    Past Medical History:  Diagnosis Date  . IUD (intrauterine device) in place 01/01/2014   Had inserted July 2013  . Other and unspecified ovarian cyst 01/01/2014   Has left ovarian cyst,3.56 x 3.01 cm  . Scoliosis   . Unspecified symptom associated with female genital organs 01/01/2014    Patient Active Problem List   Diagnosis Date Noted  . Scoliosis 01/23/2017  . Anxiety and depression 02/01/2016  . IUD (intrauterine device) in place 01/01/2014  . Unspecified symptom associated with female genital organs 01/01/2014  . Other and unspecified ovarian cyst 01/01/2014    Past Surgical History:  Procedure Laterality Date  . back surgery for scoliosisi    . WISDOM TOOTH EXTRACTION  2015    OB History    Gravida  0   Para      Term      Preterm      AB      Living        SAB      TAB      Ectopic      Multiple      Live Births               Home Medications    Prior to Admission medications   Medication Sig Start Date End Date Taking? Authorizing Provider    cephALEXin (KEFLEX) 500 MG capsule Take 1 capsule (500 mg total) by mouth 4 (four) times daily. 06/24/19   Kathyrn Drown, MD  ibuprofen (ADVIL) 800 MG tablet Take 1 tablet (800 mg total) by mouth 3 (three) times daily. 08/22/19   Vetra Shinall, Darrelyn Hillock, FNP  levonorgestrel (MIRENA) 20 MCG/24HR IUD 1 each by Intrauterine route once.    [provider]  mupirocin ointment (BACTROBAN) 2 % Apply to affected area two times daily 06/24/19 06/23/20  Kathyrn Drown, MD    Family History Family History  Problem Relation Age of Onset  . Bipolar disorder Father     Social History Social History   Tobacco Use  . Smoking status: Never Smoker  . Smokeless tobacco: Never Used  Substance Use Topics  . Alcohol use: No  . Drug use: No     Allergies   Latuda [lurasidone hcl], Viibryd [vilazodone hcl], Morphine and related, Demerol, Fentanyl, and Penicillins   Review of Systems Review of Systems  Constitutional: Negative.   HENT: Negative.   Respiratory: Negative.   Cardiovascular: Negative.   Gastrointestinal: Negative.   Neurological: Positive for  dizziness and headaches.  All other systems reviewed and are negative.    Physical Exam Triage Vital Signs ED Triage Vitals  Enc Vitals Group     BP      Pulse      Resp      Temp      Temp src      SpO2      Weight      Height      Head Circumference      Peak Flow      Pain Score      Pain Loc      Pain Edu?      Excl. in GC?    No data found.  Updated Vital Signs BP 122/80 (BP Location: Right Arm)   Pulse 70   Temp 98.4 F (36.9 C) (Oral)   Resp 17   SpO2 98%   Visual Acuity Right Eye Distance:   Left Eye Distance:   Bilateral Distance:    Right Eye Near:   Left Eye Near:    Bilateral Near:     Physical Exam Vitals and nursing note reviewed.  Constitutional:      General: She is not in acute distress.    Appearance: She is well-developed and normal weight. She is not ill-appearing, toxic-appearing or  diaphoretic.  HENT:     Head: Normocephalic.     Right Ear: Tympanic membrane, ear canal and external ear normal. There is impacted cerumen.     Left Ear: Tympanic membrane, ear canal and external ear normal. There is no impacted cerumen.  Cardiovascular:     Rate and Rhythm: Normal rate.     Pulses: Normal pulses.     Heart sounds: Normal heart sounds. No murmur. No friction rub. No gallop.   Pulmonary:     Effort: Pulmonary effort is normal. No respiratory distress.     Breath sounds: Normal breath sounds. No stridor. No wheezing, rhonchi or rales.  Chest:     Chest wall: No tenderness.  Neurological:     General: No focal deficit present.     Mental Status: She is alert and oriented to person, place, and time.     Cranial Nerves: Cranial nerves are intact. No cranial nerve deficit.     Sensory: Sensation is intact. No sensory deficit.     Motor: Motor function is intact. No weakness.     Coordination: Coordination is intact. Coordination normal.     Gait: Gait is intact. Gait normal.     Deep Tendon Reflexes: Reflexes normal.  Psychiatric:        Mood and Affect: Mood normal.        Behavior: Behavior normal.      UC Treatments / Results  Labs (all labs ordered are listed, but only abnormal results are displayed) Labs Reviewed - No data to display  EKG   Radiology No results found.  Procedures Procedures (including critical care time)  Medications Ordered in UC Medications  SUMAtriptan (IMITREX) injection 6 mg (has no administration in time range)    Initial Impression / Assessment and Plan / UC Course  I have reviewed the triage vital signs and the nursing notes.  Pertinent labs & imaging results that were available during my care of the patient were reviewed by me and considered in my medical decision making (see chart for details).    Patient is stable at discharge.  Imitrex IM was given in office. Ibuprofen 800 mg was prescribed.  Was advised to  follow-up with PCP.  Return or go to ED for worsening of symptoms.  Final Clinical Impressions(s) / UC Diagnoses   Final diagnoses:  Migraine without aura and without status migrainosus, not intractable     Discharge Instructions     Imitrex IM was given in office. Rest and push fluids Ibuprofen prescribed.  Take as directed.  This medication may not be safe in pregnancy.   Go to the ED if you have any new or worsening symptoms such as fever, chills, nausea, vomiting, vision changes, slurred speech, facial asymmetries, weakness in arms or legs, chest pain, shortness of breath, abdominal pain, worst headache of your life, symptoms do not improve with medications, etc...     ED Prescriptions    Medication Sig Dispense Auth. Provider   ibuprofen (ADVIL) 800 MG tablet Take 1 tablet (800 mg total) by mouth 3 (three) times daily. 30 tablet Lenah Messenger, Zachery Dakins, FNP     PDMP not reviewed this encounter.   Durward Parcel, FNP 08/22/19 1143

## 2019-08-22 NOTE — Discharge Instructions (Addendum)
Imitrex IM was given in office. Rest and push fluids Ibuprofen prescribed.  Take as directed.  This medication may not be safe in pregnancy.   Go to the ED if you have any new or worsening symptoms such as fever, chills, nausea, vomiting, vision changes, slurred speech, facial asymmetries, weakness in arms or legs, chest pain, shortness of breath, abdominal pain, worst headache of your life, symptoms do not improve with medications, etc..Marland Kitchen

## 2019-08-22 NOTE — ED Triage Notes (Signed)
PT HAS C/O OF HEADACHE FOR PAST 3 DAYS, HAS H/O MIGRAINES AND HAS BEEN BECOMING MORE FREQUENT

## 2019-10-16 ENCOUNTER — Encounter: Payer: 59 | Admitting: Women's Health

## 2019-10-16 ENCOUNTER — Encounter: Payer: Self-pay | Admitting: Women's Health

## 2019-10-16 ENCOUNTER — Other Ambulatory Visit: Payer: Self-pay

## 2019-10-16 ENCOUNTER — Other Ambulatory Visit (HOSPITAL_COMMUNITY)
Admission: RE | Admit: 2019-10-16 | Discharge: 2019-10-16 | Disposition: A | Discharge status: Home / Self Care | Payer: 59 | Source: Ambulatory Visit | Attending: Obstetrics and Gynecology | Admitting: Obstetrics and Gynecology

## 2019-10-16 ENCOUNTER — Ambulatory Visit (INDEPENDENT_AMBULATORY_CARE_PROVIDER_SITE_OTHER): Payer: 59 | Admitting: Women's Health

## 2019-10-16 VITALS — BP 121/83 | HR 75 | Ht 61.0 in | Wt 97.0 lb

## 2019-10-16 DIAGNOSIS — Z01419 Encounter for gynecological examination (general) (routine) without abnormal findings: Secondary | ICD-10-CM | POA: Diagnosis not present

## 2019-10-16 NOTE — Patient Instructions (Signed)
NO SEX  UNTIL AFTER YOUR NEXT APPOINTMENT

## 2019-10-16 NOTE — Progress Notes (Signed)
WELL-WOMAN EXAMINATION Patient name: Rose Kaufman MRN [id number]  Date of birth: 1996/02/23 Chief Complaint:   Gynecologic Exam  History of Present Illness:   Rose Kaufman is a 24 y.o. G52P0 Caucasian female being seen today for a routine well-woman exam.  Current complaints: past due to have IUD removed, didn't have insurance. Really wants BTL, has always been 409% certain she never wants children. Has been married for over a year, has 2 stepchildren, husband doesn't want anymore children.   Depression screen [facility] 2/9 10/16/2019 10/16/2019 01/23/2017  Decreased Interest 1 1 1   Down, Depressed, Hopeless 1 1 1   PHQ - 2 Score 2 2 2   Altered sleeping 2 3 3   Tired, decreased energy 3 2 1   Change in appetite 1 1 0  Feeling bad or failure about yourself  1 1 2   Trouble concentrating 3 3 3   Moving slowly or fidgety/restless 0 0 0  Suicidal thoughts 0 0 0  PHQ-9 Score 12 12 11   Difficult doing work/chores - - Somewhat difficult     PCP: [facility]      does not desire labs No LMP recorded. (Menstrual status: IUD). The current method of family planning is Mirena IUD inserted July 2013.  Last pap never. Results were: N/A. H/O abnormal pap: no Last mammogram: never. Results were: N/A. Family h/o breast cancer: no Last colonoscopy: never. Results were: N/A. Family h/o colorectal cancer: no Review of Systems:   Pertinent items are noted in HPI Denies any headaches, blurred vision, fatigue, shortness of breath, chest pain, abdominal pain, abnormal vaginal discharge/itching/odor/irritation, problems with periods, bowel movements, urination, or intercourse unless otherwise stated above. Pertinent History Reviewed:  Reviewed past medical,surgical, social and family history.  Reviewed problem list, medications and allergies. Physical Assessment:   Vitals:   10/16/19 1453  BP: 121/83  Pulse: 75  Weight: 97 lb (44 kg)  Height: 5\' 1"  (1.549 m)  Body mass index is 18.33 kg/m.         Physical Examination:   General appearance - well appearing, and in no distress  Mental status - alert, oriented to person, place, and time  Psych:  She has a normal mood and affect  Skin - warm and dry, normal color, no suspicious lesions noted  Chest - effort normal, all lung fields clear to auscultation bilaterally  Heart - normal rate and regular rhythm  Neck:  midline trachea, no thyromegaly or nodules  Breasts - breasts appear normal, no suspicious masses, no skin or nipple changes or  axillary nodes  Abdomen - soft, nontender, nondistended, no masses or organomegaly  Pelvic - VULVA: normal appearing vulva with no masses, tenderness or lesions  VAGINA: normal appearing vagina with normal color and discharge, no lesions  CERVIX: normal appearing cervix without discharge or lesions, no CMT, IUD strings not visible  Thin prep pap is done w/ HR HPV cotesting  UTERUS: uterus is felt to be normal size, shape, consistency and nontender   ADNEXA: No adnexal masses or tenderness noted.  Extremities:  No swelling or varicosities noted  Chaperone: Peggy Dones    No results found for this or any previous visit (from the past 24 hour(s)).  Assessment & Plan:  1) Well-Woman Exam  2) Past due for IUD removal> IUD strings not visible, really wants BTL, 811% certain she never wants any children. Schedule appt w/ [facility] to discuss, could do BTL/IUD removal at same time  Labs/procedures today: pap  Mammogram @24yo  or sooner  if problems Colonoscopy @24yo  or sooner if problems  No orders of the defined types were placed in this encounter.   Meds: No orders of the defined types were placed in this encounter.   Follow-up: Return for 1st available BTL pre-op w/ [facility] .  CNM, [facility] 10/16/2019 4:18 PM

## 2019-10-17 NOTE — Progress Notes (Signed)
This encounter was created in error - please disregard.

## 2019-10-21 LAB — CYTOLOGY - PAP
Comment: NEGATIVE
Diagnosis: NEGATIVE
High risk HPV: NEGATIVE

## 2019-10-24 ENCOUNTER — Encounter: Payer: Self-pay | Admitting: Obstetrics and Gynecology

## 2019-10-24 ENCOUNTER — Ambulatory Visit (INDEPENDENT_AMBULATORY_CARE_PROVIDER_SITE_OTHER): Payer: 59 | Admitting: Obstetrics and Gynecology

## 2019-10-24 VITALS — BP 94/64 | HR 73 | Ht 61.0 in | Wt 97.0 lb

## 2019-10-24 DIAGNOSIS — Z308 Encounter for other contraceptive management: Secondary | ICD-10-CM | POA: Diagnosis not present

## 2019-10-24 NOTE — Progress Notes (Signed)
PATIENT ID: Rose Kaufman, female     DOB: 30-Jan-1996, 24 y.o.     MRN: [id number]   [facility] Visit  10/24/19     PATIENT NAME: Rose Kaufman     MRN [id number]     DOB: 12-29-1995  CC & HPI:   Chief Complaint  Patient presents with  . discuss BTL   KATILIN IM is a 24 y.o. female presenting today to discussed bilateral tubal ligation.   She says that she has been juggling this decision for a long time and has decided that she does want either a salpingectomy or a tubal ligation.   She has two step children and she does not want her own children. She notes that she has never really wanted kids of her own. She has had a Mirena for the last seven years, but she notes that she has some pretty severe cramping with it. She also has a pretty low sex drive with birth control.   ROS:  Review of Systems  Constitutional: Negative.   HENT: Negative.   Eyes: Negative.   Respiratory: Negative.   Cardiovascular: Positive for palpitations (occasional).  Gastrointestinal: Negative.   Genitourinary: Negative.   Musculoskeletal: Negative.   Skin: Negative.   Neurological: Negative.   Endo/Heme/Allergies: Negative.   Psychiatric/Behavioral: Negative.   All other systems reviewed and are negative.    Pertinent History Reviewed:   Medical         Past Medical History:  Diagnosis Date  . IUD (intrauterine device) in place 01/01/2014   Had inserted July 2013  . Other and unspecified ovarian cyst 01/01/2014   Has left ovarian cyst,3.56 x 3.01 cm  . Scoliosis   . Unspecified symptom associated with female genital organs 01/01/2014                              Surgical Hx:    Past Surgical History:  Procedure Laterality Date  . back surgery for scoliosisi    . WISDOM TOOTH EXTRACTION  2015   Medications: Reviewed & Updated - see associated section                       Current Outpatient Medications:  .  levonorgestrel (MIRENA) 20 MCG/24HR IUD, 1 each by  Intrauterine route once., Disp: , Rfl:    Social History: Reviewed -  reports that she has never smoked. She has never used smokeless tobacco.  Objective Findings:  Vitals: Height 5\' 1"  (1.549 m), weight 97 lb (44 kg).  PHYSICAL EXAMINATION General appearance - alert, well appearing, and in no distress, oriented to person, place, and time and normal appearing weight Mental status - alert, oriented to person, place, and time, normal mood, behavior, speech, dress, motor activity, and thought processes, affect appropriate to mood Chest - not examined Heart - not examined Abdomen - not examined Breasts - not examined Skin - normal coloration and turgor, no rashes, no suspicious skin lesions noted   Assessment & Plan:   A:  1.   Discussion: Discussed with pt risks and benefits of BTL. Discussed using clips only vs bilateral salpingectomy. Advised pt that bilateral salpingectomy is a permanent procedure, but reduces the risk of cancer by 1/2 from 1% to 0.5%.  At end of discussion, pt had opportunity to ask questions and has no further questions at this time.   Specific discussion of permanent sterilization  as noted above. Greater than 50% was spent in counseling and coordination of care with the patient.   Total time greater than: 30 minutes.    2.   Note sent to Noralee Stain to schedule.  P:  1. Schedule for pre-operative exam 2. Schedule for salpingectomy   By signing my name below, I, [employer], attest that this documentation has been prepared under the direction and in the presence of Tilda Burrow, MD. Electronically Signed: Mal Misty Medical Scribe. 10/24/19. 3:29 PM.  I personally performed the services described in this documentation, which was SCRIBED in my presence. The recorded information has been reviewed and considered accurate. It has been edited as necessary during review. Tilda Burrow, MD

## 2019-11-07 ENCOUNTER — Encounter: Payer: 59 | Admitting: Obstetrics and Gynecology

## 2019-11-25 ENCOUNTER — Encounter: Payer: 59 | Admitting: Obstetrics and Gynecology

## 2019-11-25 ENCOUNTER — Telehealth: Payer: Self-pay | Admitting: Obstetrics and Gynecology

## 2019-11-25 NOTE — Telephone Encounter (Signed)
Called and left a message for Ms. Rose Kaufman. Lexi voice that if I am just make her husband regarding vasectomy issues please leave a message in my chart so that this can be arranged

## 2019-12-12 ENCOUNTER — Ambulatory Visit: Payer: 59 | Admitting: Women's Health

## 2019-12-23 ENCOUNTER — Telehealth: Payer: Self-pay | Admitting: Family Medicine

## 2019-12-23 NOTE — Telephone Encounter (Signed)
Discussed with pt's mother and she verbalized understanding.  

## 2019-12-23 NOTE — Telephone Encounter (Signed)
If her son was positive then she would have to self isolate and quarantine as well But since her son was negative she does not have to  Obviously if she starts exhibiting any signs of Covid such as sore throat headache body aches fever chills she should consider testing Also if she has not done the vaccine at this point she should give strong consideration to getting it done

## 2019-12-23 NOTE — Telephone Encounter (Signed)
See message below. Pt states she is not having any symptoms. Son has sorethroat, congestion and headache. He was Exposed to covid on Thursday. Son Had rapid test this morning and it was negative. Son did not see a doctor. Pt states her job wanted her to check with her doctor to see if she needs to be tested before coming back to work.

## 2019-12-23 NOTE — Telephone Encounter (Signed)
Pt's step son had to be tested for COVID on Friday. His results are negative. Pt's job wanted her to check with her provider to see if she needs to be tested before returning to work.

## 2019-12-27 ENCOUNTER — Other Ambulatory Visit: Payer: Self-pay

## 2019-12-27 ENCOUNTER — Encounter: Payer: 59 | Admitting: Women's Health

## 2019-12-27 ENCOUNTER — Encounter: Payer: Self-pay | Admitting: Women's Health

## 2019-12-30 NOTE — Progress Notes (Signed)
This encounter was created in error - please disregard. Pt here for IUD removal/reinsert, IUD is past due for removal and had sex last night. Will reschedule for 2wks, no sex, UPT when she comes in.  Cheral Marker, CNM, [facility] 12/30/2019 1:24 PM

## 2020-01-10 ENCOUNTER — Encounter: Payer: Self-pay | Admitting: Women's Health

## 2020-01-10 ENCOUNTER — Ambulatory Visit (INDEPENDENT_AMBULATORY_CARE_PROVIDER_SITE_OTHER): Payer: 59 | Admitting: Women's Health

## 2020-01-10 VITALS — BP 103/72 | HR 73 | Ht 61.0 in | Wt 95.6 lb

## 2020-01-10 DIAGNOSIS — Z30430 Encounter for insertion of intrauterine contraceptive device: Secondary | ICD-10-CM

## 2020-01-10 DIAGNOSIS — Z3202 Encounter for pregnancy test, result negative: Secondary | ICD-10-CM | POA: Diagnosis not present

## 2020-01-10 DIAGNOSIS — Z30432 Encounter for removal of intrauterine contraceptive device: Secondary | ICD-10-CM | POA: Diagnosis not present

## 2020-01-10 LAB — POCT URINE PREGNANCY: Preg Test, Ur: NEGATIVE

## 2020-01-10 MED ORDER — PARAGARD INTRAUTERINE COPPER IU IUD: INTRAUTERINE_SYSTEM | Freq: Once | INTRAUTERINE | Status: AC

## 2020-01-10 NOTE — Progress Notes (Signed)
   IUD REMOVAL & RE-INSERTION Patient name: Rose Kaufman MRN [id number]  Date of birth: Oct 22, 1995 Subjective Findings:   @Rose  K Kaufman is a 24 y.o. G0P0 Caucasian female being seen today for removal of a Mirena  IUD and insertion of a Paragard  IUD. Her IUD was placed July 2013. Had wanted BTL, but couldn't afford it or for her husband to get a vasectomy. Depression screen [facility] 2/9 10/16/2019 10/16/2019 01/23/2017  Decreased Interest 1 1 1   Down, Depressed, Hopeless 1 1 1   PHQ - 2 Score 2 2 2   Altered sleeping 2 3 3   Tired, decreased energy 3 2 1   Change in appetite 1 1 0  Feeling bad or failure about yourself  1 1 2   Trouble concentrating 3 3 3   Moving slowly or fidgety/restless 0 0 0  Suicidal thoughts 0 0 0  PHQ-9 Score 12 12 11   Difficult doing work/chores - - Somewhat difficult    No LMP recorded. (Menstrual status: IUD). Last sexual intercourse was >2wks ago Last pap6/9/21. Results were:  normal  The risks and benefits of the method and placement have been thouroughly reviewed with the patient and all questions were answered.  Specifically the patient is aware of failure rate of 05/998, expulsion of the IUD and of possible perforation.  The patient is aware of irregular bleeding due to the method and understands the incidence of irregular bleeding diminishes with time.  Signed copy of informed consent in chart.  Pertinent History Reviewed:   Reviewed past medical,surgical, social, obstetrical and family history.  Reviewed problem list, medications and allergies. Objective Findings & Procedure:    Vitals:   01/10/20 0855  BP: 103/72  Pulse: 73  Weight: 95 lb 9.6 oz (43.4 kg)  Height: 5\' 1"  (1.549 m)  Body mass index is 18.06 kg/m.  Results for orders placed or performed in visit on 01/10/20 (from the past 24 hour(s))  POCT urine pregnancy   Collection Time: 01/10/20  9:10 AM  Result Value Ref Range   Preg Test, Ur Negative Negative     Time out was  performed.  A graves speculum was placed in the vagina.  The cervix was visualized, prepped using Betadine. The strings were not visible. They were not in cervix, single tooth tenaculum applied to cervix, cervix was dilated, then curved forceps introduced slightly into uterus and IUD removed intact. The cervix was prepped again w/ betadine. The uterus was found to be neutral and it sounded to 7 cm.  Paragard  IUD placed per manufacturer's recommendations without complications. The strings were trimmed to approximately 3 cm.  The patient tolerated the procedure well.   Informal transvaginal sonogram was performed and the proper placement of the IUD was verified.   Chaperone: Amanda Rash   Assessment & Plan:   1) Mirena IUD removal & Paragard insertion The patient was given post procedure instructions, including signs and symptoms of infection and to check for the strings after each menses or each month, and refraining from intercourse or anything in the vagina for 3 days. She was given a care card with date IUD placed, and date IUD to be removed. She is scheduled for a f/u appointment in 4 weeks.  Orders Placed This Encounter  Procedures  . POCT urine pregnancy    Follow-up: Return in about 4 weeks (around 02/07/2020) for IUD F/U in person w/ me.  CNM, [facility] 01/10/2020 9:47 AM

## 2020-01-10 NOTE — Patient Instructions (Signed)
 Nothing in vagina for 3 days (no sex, douching, tampons, etc...)  Check your strings once a month to make sure you can feel them, if you are not able to please let us know  If you develop a fever of 100.4 or more in the next few weeks, or if you develop severe abdominal pain, please let us know  Use a backup method of birth control, such as condoms, for 2 weeks     Intrauterine Device Insertion, Care After  This sheet gives you information about how to care for yourself after your procedure. Your health care provider may also give you more specific instructions. If you have problems or questions, contact your health care provider. What can I expect after the procedure? After the procedure, it is common to have:  Cramps and pain in the abdomen.  Light bleeding (spotting) or heavier bleeding that is like your menstrual period. This may last for up to a few days.  Lower back pain.  Dizziness.  Headaches.  Nausea. Follow these instructions at home:  Before resuming sexual activity, check to make sure that you can feel the IUD string(s). You should be able to feel the end of the string(s) below the opening of your cervix. If your IUD string is in place, you may resume sexual activity. ? If you had a hormonal IUD inserted more than 7 days after your most recent period started, you will need to use a backup method of birth control for 7 days after IUD insertion. Ask your health care provider whether this applies to you.  Continue to check that the IUD is still in place by feeling for the string(s) after every menstrual period, or once a month.  Take over-the-counter and prescription medicines only as told by your health care provider.  Do not drive or use heavy machinery while taking prescription pain medicine.  Keep all follow-up visits as told by your health care provider. This is important. Contact a health care provider if:  You have bleeding that is heavier or lasts longer  than a normal menstrual cycle.  You have a fever.  You have cramps or abdominal pain that get worse or do not get better with medicine.  You develop abdominal pain that is new or is not in the same area of earlier cramping and pain.  You feel lightheaded or weak.  You have abnormal or bad-smelling discharge from your vagina.  You have pain during sexual activity.  You have any of the following problems with your IUD string(s): ? The string bothers or hurts you or your sexual partner. ? You cannot feel the string. ? The string has gotten longer.  You can feel the IUD in your vagina.  You think you may be pregnant, or you miss your menstrual period.  You think you may have an STI (sexually transmitted infection). Get help right away if:  You have flu-like symptoms.  You have a fever and chills.  You can feel that your IUD has slipped out of place. Summary  After the procedure, it is common to have cramps and pain in the abdomen. It is also common to have light bleeding (spotting) or heavier bleeding that is like your menstrual period.  Continue to check that the IUD is still in place by feeling for the string(s) after every menstrual period, or once a month.  Keep all follow-up visits as told by your health care provider. This is important.  Contact your health care provider   if you have problems with your IUD string(s), such as the string getting longer or bothering you or your sexual partner. This information is not intended to replace advice given to you by your health care provider. Make sure you discuss any questions you have with your health care provider. Document Revised: 04/07/2017 Document Reviewed: 03/16/2016 Elsevier Patient Education  2020 Elsevier Inc.  

## 2020-01-12 ENCOUNTER — Ambulatory Visit
Admission: EM | Admit: 2020-01-12 | Discharge: 2020-01-12 | Disposition: A | Discharge status: Home / Self Care | Payer: 59 | Attending: Emergency Medicine | Admitting: Emergency Medicine

## 2020-01-12 ENCOUNTER — Other Ambulatory Visit: Payer: Self-pay

## 2020-01-12 DIAGNOSIS — R112 Nausea with vomiting, unspecified: Secondary | ICD-10-CM | POA: Diagnosis not present

## 2020-01-12 MED ORDER — ONDANSETRON HCL 4 MG PO TABS: 4.0000 mg | ORAL_TABLET | Freq: Four times a day (QID) | ORAL | 0 refills | Status: DC

## 2020-01-12 NOTE — ED Triage Notes (Signed)
Pt began having nausea and vomiting after having nausea and vomiting after iud replacement on Friday. States she had similar response when she had the first one inserted

## 2020-01-12 NOTE — Discharge Instructions (Signed)
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
## 2020-01-30 ENCOUNTER — Telehealth: Payer: Self-pay | Admitting: Family Medicine

## 2020-01-30 NOTE — Telephone Encounter (Signed)
Yes per cdc site, these are normal side effects of the vaccine.   -Tiredness Headache Muscle pain Chills Fever Nausea  Just cont to drink fluids and keep hydrated.  Eat bland light diet.   Call if worsening.   Dr. Ladona Ridgel

## 2020-01-30 NOTE — Telephone Encounter (Signed)
Please advise. Thank you

## 2020-01-30 NOTE — Telephone Encounter (Signed)
How many times of vomiting?   Not normally a side effect of the vaccine.   But would continue to monitor and if getting profuse vomiting needing to go to urgent care or er for fluids.   If not able to keep anything down.  Dr. Ladona Ridgel

## 2020-01-30 NOTE — Telephone Encounter (Signed)
Pt contacted. Pt state that this morning at work she dry heaved about three times. Also has been nauseated. Left work around 2 and has been asleep every since. Pt had vaccine yesterday and having normal side effects such as headache and fever. Please advise if dry heaving and nausea are side effects. Thank you.

## 2020-01-30 NOTE — Telephone Encounter (Signed)
Pt called and left vm stating she got 2nd covid shot yesterday and is having vomiting and would like to know if she should be concerned. They didn't tell her that would be a side effect. Also states she is having body aches like she never has before.

## 2020-01-31 NOTE — Telephone Encounter (Signed)
Pt contacted and verbalized understanding. Pt states she is feeling better today.

## 2020-02-07 ENCOUNTER — Other Ambulatory Visit (HOSPITAL_COMMUNITY)
Admission: RE | Admit: 2020-02-07 | Discharge: 2020-02-07 | Disposition: A | Discharge status: Home / Self Care | Payer: 59 | Source: Ambulatory Visit | Attending: Obstetrics & Gynecology | Admitting: Obstetrics & Gynecology

## 2020-02-07 ENCOUNTER — Encounter: Payer: Self-pay | Admitting: Women's Health

## 2020-02-07 ENCOUNTER — Ambulatory Visit (INDEPENDENT_AMBULATORY_CARE_PROVIDER_SITE_OTHER): Payer: 59 | Admitting: Women's Health

## 2020-02-07 VITALS — BP 98/68 | HR 74 | Ht 61.0 in | Wt 93.0 lb

## 2020-02-07 DIAGNOSIS — Z30431 Encounter for routine checking of intrauterine contraceptive device: Secondary | ICD-10-CM | POA: Diagnosis not present

## 2020-02-07 DIAGNOSIS — N898 Other specified noninflammatory disorders of vagina: Secondary | ICD-10-CM | POA: Diagnosis not present

## 2020-02-07 DIAGNOSIS — N941 Unspecified dyspareunia: Secondary | ICD-10-CM

## 2020-02-07 NOTE — Progress Notes (Signed)
   GYN VISIT Patient name: Rose Kaufman MRN [id number]  Date of birth: 1996-02-06 Chief Complaint:   iud check  History of Present Illness:   Rose Kaufman is a 24 y.o. G0P0 Caucasian female being seen today for IUD check. Had Paragard inserted 01/10/20. Had n/v x 2 days, much better now. Bled x 1 week, none since. No pain. Does have pain w/ sex, this has been going on for 1.5-94yrs now, especially if not in [employer] position, has sharp pains. No pain at any other time of the month. No family h/s endometriosis that she is aware of. Some discharge, no itching/odor/irritation.  Depression screen [facility] 2/9 10/16/2019 10/16/2019 01/23/2017  Decreased Interest 1 1 1   Down, Depressed, Hopeless 1 1 1   PHQ - 2 Score 2 2 2   Altered sleeping 2 3 3   Tired, decreased energy 3 2 1   Change in appetite 1 1 0  Feeling bad or failure about yourself  1 1 2   Trouble concentrating 3 3 3   Moving slowly or fidgety/restless 0 0 0  Suicidal thoughts 0 0 0  PHQ-9 Score 12 12 11   Difficult doing work/chores - - Somewhat difficult    No LMP recorded. (Menstrual status: IUD). The current method of family planning is IUD.  Last pap 10/16/19. Results were:  normal Review of Systems:   Pertinent items are noted in HPI Denies fever/chills, dizziness, headaches, visual disturbances, fatigue, shortness of breath, chest pain, abdominal pain, vomiting, abnormal vaginal discharge/itching/odor/irritation, problems with periods, bowel movements, urination, or intercourse unless otherwise stated above.  Pertinent History Reviewed:  Reviewed past medical,surgical, social, obstetrical and family history.  Reviewed problem list, medications and allergies. Physical Assessment:   Vitals:   02/07/20 0904  BP: 98/68  Pulse: 74  Weight: 93 lb (42.2 kg)  Height: 5\' 1"  (1.549 m)  Body mass index is 17.57 kg/m.       Physical Examination:   General appearance: alert, well appearing, and in no distress  Mental status: alert,  oriented to person, place, and time  Skin: warm & dry   Cardiovascular: normal heart rate noted  Respiratory: normal respiratory effort, no distress  Abdomen: soft, non-tender   Pelvic: VULVA: normal appearing vulva with no masses, tenderness or lesions, VAGINA: normal appearing vagina with normal color and discharge, no lesions, CERVIX: normal appearing cervix without discharge or lesions, IUD strings visible, CV swab collected  Extremities: no edema   Chaperone:    No results found for this or any previous visit (from the past 24 hour(s)).  Assessment & Plan:  1) IUD check> IUD in place  2) Dyspareunia> x 1.5-25yrs, CV swab sent, if neg discussed trying Orilissa to see if helps  3) Vaginal discharge> CV swab  Meds: No orders of the defined types were placed in this encounter.   No orders of the defined types were placed in this encounter.   Return in about 1 year (around 02/06/2021) for Physical.  CNM, [facility] 02/07/2020 9:43 AM

## 2020-02-12 LAB — CERVICOVAGINAL ANCILLARY ONLY
Bacterial Vaginitis (gardnerella): POSITIVE — AB
Candida Glabrata: NEGATIVE
Candida Vaginitis: NEGATIVE
Chlamydia: NEGATIVE
Comment: NEGATIVE
Comment: NEGATIVE
Comment: NEGATIVE
Comment: NEGATIVE
Comment: NEGATIVE
Comment: NORMAL
Neisseria Gonorrhea: NEGATIVE
Trichomonas: NEGATIVE

## 2020-02-18 ENCOUNTER — Other Ambulatory Visit: Payer: Self-pay | Admitting: Women's Health

## 2020-02-18 MED ORDER — METRONIDAZOLE 500 MG PO TABS: 500.0000 mg | ORAL_TABLET | Freq: Two times a day (BID) | ORAL | 0 refills | Status: DC

## 2020-05-22 ENCOUNTER — Other Ambulatory Visit: Payer: Self-pay

## 2020-05-22 ENCOUNTER — Encounter: Payer: Self-pay | Admitting: Emergency Medicine

## 2020-05-22 ENCOUNTER — Ambulatory Visit
Admission: EM | Admit: 2020-05-22 | Discharge: 2020-05-22 | Disposition: A | Discharge status: Home / Self Care | Payer: 59 | Attending: Emergency Medicine | Admitting: Emergency Medicine

## 2020-05-22 DIAGNOSIS — F419 Anxiety disorder, unspecified: Secondary | ICD-10-CM

## 2020-05-22 MED ORDER — HYDROXYZINE HCL 50 MG PO TABS: 50.0000 mg | ORAL_TABLET | Freq: Four times a day (QID) | ORAL | 0 refills | Status: DC | PRN
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
## 2020-05-22 NOTE — Discharge Instructions (Addendum)
Rest and drink fluids Eat a well-balanced diet, and avoid excessive caffeine intake Continue with hydroxyzine nightly for symptom relief Some things you may try doing to help alleviate your symptoms include: keeping a journal, exercise, talking to a friend or relative, listening to music, going for a walk or hike outside, or other activities that you may find enjoyable PCP assistance initiated Recommending further evaluation and management with PCP Return or go to ER if you have any new or worsening symptoms such as fever, chills, fatigue, worsening shortness of breath, wheezing, chest pain, nausea, vomiting, abdominal pain, changes in bowel or bladder habits, etc... 

## 2020-05-22 NOTE — ED Triage Notes (Signed)
Since this morning have chest tightness, racing heart rate, and feels like she cant take a good breath.  Pt does have a history of anxiety attacks.  She use to take medication for this but lost her insurance.

## 2020-05-28 ENCOUNTER — Encounter (HOSPITAL_COMMUNITY): Payer: Self-pay | Admitting: *Deleted

## 2020-05-28 ENCOUNTER — Other Ambulatory Visit: Payer: Self-pay

## 2020-05-28 ENCOUNTER — Emergency Department (HOSPITAL_COMMUNITY): Payer: 59

## 2020-05-28 ENCOUNTER — Emergency Department (HOSPITAL_COMMUNITY)
Admission: EM | Admit: 2020-05-28 | Discharge: 2020-05-28 | Disposition: A | Discharge status: Home / Self Care | Payer: 59 | Attending: Emergency Medicine | Admitting: Emergency Medicine

## 2020-05-28 DIAGNOSIS — S0083XA Contusion of other part of head, initial encounter: Secondary | ICD-10-CM | POA: Diagnosis not present

## 2020-05-28 DIAGNOSIS — S20212A Contusion of left front wall of thorax, initial encounter: Secondary | ICD-10-CM | POA: Insufficient documentation

## 2020-05-28 DIAGNOSIS — S0990XA Unspecified injury of head, initial encounter: Secondary | ICD-10-CM | POA: Diagnosis present

## 2020-05-28 DIAGNOSIS — S50312A Abrasion of left elbow, initial encounter: Secondary | ICD-10-CM | POA: Diagnosis not present

## 2020-05-28 DIAGNOSIS — T7491XA Unspecified adult maltreatment, confirmed, initial encounter: Secondary | ICD-10-CM

## 2020-05-28 MED ORDER — IBUPROFEN 400 MG PO TABS
600.0000 mg | ORAL_TABLET | Freq: Once | ORAL | Status: AC
  Administered 2020-05-28: 600 mg via ORAL
  Filled 2020-05-28: qty 2
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
## 2020-06-22 ENCOUNTER — Encounter: Payer: Self-pay | Admitting: Family Medicine

## 2020-06-22 ENCOUNTER — Other Ambulatory Visit: Payer: Self-pay

## 2020-06-22 ENCOUNTER — Ambulatory Visit: Payer: 59 | Admitting: Family Medicine

## 2020-06-22 VITALS — BP 110/62 | HR 73 | Temp 97.3°F | Ht 61.0 in | Wt 95.0 lb

## 2020-06-22 DIAGNOSIS — R112 Nausea with vomiting, unspecified: Secondary | ICD-10-CM | POA: Diagnosis not present

## 2020-06-22 DIAGNOSIS — R1032 Left lower quadrant pain: Secondary | ICD-10-CM

## 2020-06-22 DIAGNOSIS — R1013 Epigastric pain: Secondary | ICD-10-CM | POA: Diagnosis not present

## 2020-06-22 NOTE — Progress Notes (Signed)
Pt here for abdominal issues. For the past couple of years, it comes and goes. Usually about twice a year. Each time she eats her stomach hurts. Has tried a bland diet. Weak and dizzy spells at times.  Friday-light headed, chest pain, vomited, dizzy and had to go home. 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 Pt states that she notices these symptoms when she is stationary.    Patient ID: Rose Kaufman, female    DOB: 1995-10-11, 25 y.o.   MRN: [id number]   Chief Complaint  Patient presents with  . Nausea   Subjective:  CC: nausea, weak and shaky  This is a new problem.  Presents today with a complaint of nausea after eating.  Works as a Patent attorney, she was in surgery, had to leave the surgery due to feeling dizzy, having tunnel vision, felt like she was going to pass out.  Symptoms have been present for 1 week.  She does associate that she has anxiety and feels cramping in her chest at times.  She is having nausea and dry heaves.  Also having some abdominal pain.  Questioned about possible pregnancy she reports that she has an IUD and is currently on her menstrual cycle.  Said her GYN questions whether she has endometriosis due to painful intercourse.  She does describe that the symptoms are present with eating.  She is on the lower end of her normal weight right now she denies having any pain between her shoulder blades she is not constipated, has a very regular bowel movement schedule.  Denies fever and chills has been eating differently due to the symptoms.    Medical History Chayna has a past medical history of IUD (intrauterine device) in place (01/01/2014), Other and unspecified ovarian cyst (01/01/2014), Scoliosis, and Unspecified symptom associated with female genital organs (01/01/2014).   Outpatient Encounter Medications as of 06/22/2020  Medication Sig  . [DISCONTINUED] hydrOXYzine  (ATARAX/VISTARIL) 50 MG tablet Take 1 tablet (50 mg total) by mouth every 6 (six) hours as needed for anxiety.  . [DISCONTINUED] metroNIDAZOLE (FLAGYL) 500 MG tablet Take 1 tablet (500 mg total) by mouth 2 (two) times daily. (Patient not taking: Reported on 05/28/2020)  . [DISCONTINUED] ondansetron (ZOFRAN) 4 MG tablet Take 1 tablet (4 mg total) by mouth every 6 (six) hours. (Patient not taking: No sig reported)  . [DISCONTINUED] PARAGARD INTRAUTERINE COPPER IU by Intrauterine route.   No facility-administered encounter medications on file as of 06/22/2020.     Review of Systems  Constitutional: Positive for appetite change. Negative for chills and fever.  Respiratory: Negative for shortness of breath.   Cardiovascular: Positive for chest pain.       Friday felt chest pains - associated with anxiety.   Gastrointestinal: Positive for nausea and vomiting. Negative for abdominal pain, constipation and diarrhea.  Neurological: Positive for dizziness and light-headedness.       Felt like passing out on Friday.   Psychiatric/Behavioral: Negative for self-injury. The patient is nervous/anxious.        In counseling for anxiety for OCD/ ADHD.      Vitals BP 110/62   Pulse 73   Temp (!) 97.3 F (36.3 C)   Ht 5\' 1"  (1.549 m)   Wt 95 lb (43.1 kg)   LMP 05/28/2020   SpO2 100%   BMI 17.95 kg/m   Objective:   Physical Exam Vitals  reviewed.  Constitutional:      General: She is not in acute distress.    Appearance: Normal appearance.  Cardiovascular:     Rate and Rhythm: Normal rate and regular rhythm.     Heart sounds: Normal heart sounds.  Pulmonary:     Effort: Pulmonary effort is normal.     Breath sounds: Normal breath sounds.  Abdominal:     General: Bowel sounds are normal.     Tenderness: There is abdominal tenderness in the right lower quadrant, epigastric area and left lower quadrant. There is no guarding or rebound.     Comments: Pain worse LLQ 4/10 today. Not constipated-  2 BMs per day. Epigastric area "burning".   Skin:    General: Skin is warm and dry.  Neurological:     General: No focal deficit present.     Mental Status: She is alert.  Psychiatric:        Behavior: Behavior normal.      Assessment and Plan   1. Nausea and vomiting, intractability of vomiting not specified, unspecified vomiting type  2. Left lower quadrant abdominal pain - CBC with Differential - Comprehensive Metabolic Panel (CMET)  3. Epigastric abdominal pain - Amylase - Lipase   Has not had  lab work in several years.  Will check CBC, CMP, amylase and lipase to assess abdominal pathology.  She would like to get these labs, eliminate some things from her diet starting with dairy and then gluten,  to see if her symptoms are improved.  Plan: Get lab work today Eliminate dairy from her diet, then Eliminate gluten from her diet Follow-up in 2 weeks to discuss lab work, will notify once they are available if anything greatly abnormal.  Will consider abdominal ultrasound and/ or GI specialist referral if symptoms do not improve.   Agrees with plan of care discussed today. Understands warning signs to seek further care: chest pain, shortness of breath, any significant change in health.  Understands to follow-up in 2 weeks, sooner if anything changes.  Instructed on red flag symptoms, fever, chills, increasing abdominal pain-to seek higher level of care.   Novella Olive, NP 06/22/2020

## 2020-06-22 NOTE — Patient Instructions (Addendum)
Abdominal Pain, Adult Many things can cause belly (abdominal) pain. Most times, belly pain is not dangerous. Many cases of belly pain can be watched and treated at home. Sometimes, though, belly pain is serious. Your doctor will try to find the cause of your belly pain. Follow these instructions at home: Medicines  Take over-the-counter and prescription medicines only as told by your doctor.  Do not take medicines that help you poop (laxatives) unless told by your doctor. General instructions  Watch your belly pain for any changes.  Drink enough fluid to keep your pee (urine) pale yellow.  Keep all follow-up visits as told by your doctor. This is important.   Contact a doctor if:  Your belly pain changes or gets worse.  You are not hungry, or you lose weight without trying.  You are having trouble pooping (constipated) or have watery poop (diarrhea) for more than 2-3 days.  You have pain when you pee or poop.  Your belly pain wakes you up at night.  Your pain gets worse with meals, after eating, or with certain foods.  You are vomiting and cannot keep anything down.  You have a fever.  You have blood in your pee. Get help right away if:  Your pain does not go away as soon as your doctor says it should.  You cannot stop vomiting.  Your pain is only in areas of your belly, such as the right side or the left lower part of the belly.  You have bloody or black poop, or poop that looks like tar.  You have very bad pain, cramping, or bloating in your belly.  You have signs of not having enough fluid or water in your body (dehydration), such as: ? Dark pee, very little pee, or no pee. ? Cracked lips. ? Dry mouth. ? Sunken eyes. ? Sleepiness. ? Weakness.  You have trouble breathing or chest pain. Summary  Many cases of belly pain can be watched and treated at home.  Watch your belly pain for any changes.  Take over-the-counter and prescription medicines only as  told by your doctor.  Contact a doctor if your belly pain changes or gets worse.  Get help right away if you have very bad pain, cramping, or bloating in your belly. This information is not intended to replace advice given to you by your health care provider. Make sure you discuss any questions you have with your health care provider. Document Revised: 09/03/2018 Document Reviewed: 09/03/2018 Elsevier Patient Education  2021 Elsevier Inc. Lactose-Controlled Eating Plan, Adult Lactose intolerance is when the body is not able to digest lactose, a natural sugar that is found in milk and milk products. If you are lactose intolerant, avoiding foods and drinks that contain lactose may help ease digestive problems such as diarrhea or stomach pain. What are tips for following this plan? Reading food labels  Check food ingredient lists carefully. Avoid foods made with butter, cream, milk, milk solids, milk powder, curd, caseinate, or whey.  Avoid products with the note "may contain milk."  Look for the words "lactose-free" or "lactose-reduced" on food labels. You can eat lactose-free foods, and you may be able to eat small amounts of lactose-reduced foods. Shopping  Look for nondairy substitutes, such as: ? Nondairy creamer. ? Almond or soy milk. ? Soy or coconut yogurt. ? Dairy-free cheese.  Buy lactose-free cow milk. Cooking  Avoid cooking with butter. Use vegetable, nut, and seed oils instead.  Prepare soups without cream. Use  other products to thicken soups, such as corn starch or tomato paste. Meal planning  Avoid eating foods that contain lactose.  Some people with lactose intolerance can eat foods that contain small amounts of lactose. Foods that contain less than 1 gram of lactose per serving include: ? 1-2 oz of aged cheese, such as Parmesan, Swiss, or cheddar. ? 2 Tbsp of cream cheese. ? ? cup of cottage cheese. ?  cup of ricotta cheese.  Some people are able to tolerate  cultured dairy products, such as yogurt, buttermilk, and kefir. The healthy bacteria in these products helps you digest lactose.  If you decide to try a food that contains lactose: ? Eat only one food with lactose in it at a time. ? Eat only a small amount of the food. ? Stop eating the food if your symptoms return. Getting enough calcium  Milk and milk products contain a lot of calcium, which is an important nutrient for your health. When you avoid milk and milk products, make sure to get calcium from other foods.  Talk with your health care provider about how much calcium you need each day. The amount of calcium you need each day depends on your age and overall health.  Nondairy foods that are high in calcium include: ? Sardines and canned salmon. ? Dried beans. ? Almonds. ? Turnip greens, collards, kale, and broccoli. ? Calcium-fortified soy milk and tofu. ? Calcium-fortified orange juice.  Talk with your health care provider about vitamin and mineral supplements. Take supplements only as directed. Summary  Avoiding foods and drinks that contain lactose may help ease digestive problems such as diarrhea or stomach pain.  When you avoid milk and milk products, make sure to get calcium from other foods.  Take vitamin and mineral supplements only as directed by your health care provider. This information is not intended to replace advice given to you by your health care provider. Make sure you discuss any questions you have with your health care provider. Document Revised: 04/07/2017 Document Reviewed: 08/05/2016 Elsevier Patient Education  2021 [employer].

## 2020-06-23 LAB — CBC WITH DIFFERENTIAL/PLATELET
Basophils Absolute: 0.1 10*3/uL (ref 0.0–0.2)
Basos: 1 %
EOS (ABSOLUTE): 0.3 10*3/uL (ref 0.0–0.4)
Eos: 4 %
Hematocrit: 41.1 % (ref 34.0–46.6)
Hemoglobin: 13.5 g/dL (ref 11.1–15.9)
Immature Grans (Abs): 0 10*3/uL (ref 0.0–0.1)
Immature Granulocytes: 0 %
Lymphocytes Absolute: 3.2 10*3/uL — ABNORMAL HIGH (ref 0.7–3.1)
Lymphs: 37 %
MCH: 29.8 pg (ref 26.6–33.0)
MCHC: 32.8 g/dL (ref 31.5–35.7)
MCV: 91 fL (ref 79–97)
Monocytes Absolute: 0.8 10*3/uL (ref 0.1–0.9)
Monocytes: 9 %
Neutrophils Absolute: 4.4 10*3/uL (ref 1.4–7.0)
Neutrophils: 49 %
Platelets: 344 10*3/uL (ref 150–450)
RBC: 4.53 x10E6/uL (ref 3.77–5.28)
RDW: 11.6 % — ABNORMAL LOW (ref 11.7–15.4)
WBC: 8.9 10*3/uL (ref 3.4–10.8)

## 2020-06-23 LAB — COMPREHENSIVE METABOLIC PANEL
ALT: 9 IU/L (ref 0–32)
AST: 16 IU/L (ref 0–40)
Albumin/Globulin Ratio: 2.1 (ref 1.2–2.2)
Albumin: 4.8 g/dL (ref 3.9–5.0)
Alkaline Phosphatase: 59 IU/L (ref 44–121)
BUN/Creatinine Ratio: 14 (ref 9–23)
BUN: 9 mg/dL (ref 6–20)
Bilirubin Total: 0.4 mg/dL (ref 0.0–1.2)
CO2: 25 mmol/L (ref 20–29)
Calcium: 9.2 mg/dL (ref 8.7–10.2)
Chloride: 104 mmol/L (ref 96–106)
Creatinine, Ser: 0.65 mg/dL (ref 0.57–1.00)
GFR calc Af Amer: 142 mL/min/{1.73_m2} (ref >59)
GFR calc non Af Amer: 123 mL/min/{1.73_m2} (ref >59)
Globulin, Total: 2.3 g/dL (ref 1.5–4.5)
Glucose: 64 mg/dL — ABNORMAL LOW (ref 65–99)
Potassium: 4.2 mmol/L (ref 3.5–5.2)
Sodium: 142 mmol/L (ref 134–144)
Total Protein: 7.1 g/dL (ref 6.0–8.5)

## 2020-06-23 LAB — LIPASE: Lipase: 39 U/L (ref 14–72)

## 2020-06-23 LAB — AMYLASE: Amylase: 71 U/L (ref 31–110)

## 2020-07-06 ENCOUNTER — Ambulatory Visit: Payer: 59 | Admitting: Family Medicine

## 2020-07-06 ENCOUNTER — Encounter: Payer: Self-pay | Admitting: Family Medicine

## 2020-07-06 ENCOUNTER — Encounter: Payer: 59 | Admitting: Family Medicine

## 2021-04-30 ENCOUNTER — Ambulatory Visit
Admission: EM | Admit: 2021-04-30 | Discharge: 2021-04-30 | Disposition: A | Discharge status: Home / Self Care | Payer: 59

## 2021-04-30 ENCOUNTER — Other Ambulatory Visit: Payer: Self-pay

## 2021-04-30 DIAGNOSIS — J029 Acute pharyngitis, unspecified: Secondary | ICD-10-CM

## 2021-04-30 NOTE — Discharge Instructions (Signed)
Return if any problems.

## 2021-04-30 NOTE — ED Triage Notes (Signed)
Patient states that yesterday her throat started hurting after eating. She looked in her throat and her left tonsil was swollen and had white bumps.   Patient states states she tried busting the white bumps with her fingers and a Qtip but it just agitated it.   Patient states she does not have any other symptoms of cold, covid or flu.  Denies Fever

## 2021-04-30 NOTE — ED Provider Notes (Signed)
[facility]    CSN: [id number] Arrival date & time: 04/30/21  7893      History   Chief Complaint Chief Complaint  Patient presents with   Sore Throat    Swollen tonsils and sore throat     HPI Rose Kaufman is a 25 y.o. female.   The history is provided by the patient. No language interpreter was used.  Sore Throat This is a new problem. The current episode started 2 days ago. The problem occurs constantly. The problem has been gradually worsening. Pertinent negatives include no shortness of breath. Nothing aggravates the symptoms. Nothing relieves the symptoms. She has tried nothing for the symptoms. The treatment provided no relief.   Past Medical History:  Diagnosis Date   IUD (intrauterine device) in place 01/01/2014   Had inserted July 2013   Other and unspecified ovarian cyst 01/01/2014   Has left ovarian cyst,3.56 x 3.01 cm   Scoliosis    Unspecified symptom associated with female genital organs 01/01/2014    Patient Active Problem List   Diagnosis Date Noted   Nausea and vomiting 06/22/2020   Left lower quadrant abdominal pain 06/22/2020   Epigastric abdominal pain 06/22/2020   Scoliosis 01/23/2017   Anxiety and depression 02/01/2016   Encounter for IUD insertion 01/01/2014   Other and unspecified ovarian cyst 01/01/2014    Past Surgical History:  Procedure Laterality Date   back surgery for scoliosisi     WISDOM TOOTH EXTRACTION  2015    OB History     Gravida  0   Para      Term      Preterm      AB      Living         SAB      IAB      Ectopic      Multiple      Live Births               Home Medications    Prior to Admission medications   Not on File    Family History Family History  Problem Relation Age of Onset   Bipolar disorder Father     Social History Social History   Tobacco Use   Smoking status: Never   Smokeless tobacco: Never  Vaping Use   Vaping Use: Every day   Substances:  THC  Substance Use Topics   Alcohol use: No   Drug use: Yes    Types: Other-see comments, Marijuana    Comment: THC     Allergies   Latuda [lurasidone hcl], Viibryd [vilazodone hcl], Morphine and related, Demerol, Fentanyl, and Penicillins   Review of Systems Review of Systems  HENT:  Positive for sore throat.   Respiratory:  Negative for shortness of breath.   All other systems reviewed and are negative.   Physical Exam Triage Vital Signs ED Triage Vitals  Enc Vitals Group     BP 04/30/21 0917 111/79     Pulse Rate 04/30/21 0917 71     Resp 04/30/21 0917 16     Temp 04/30/21 0917 98.9 F (37.2 C)     Temp Source 04/30/21 0917 Oral     SpO2 04/30/21 0917 99 %     Weight --      Height --      Head Circumference --      Peak Flow --      Pain Score 04/30/21 0915 6  Pain Loc --      Pain Edu? --      Excl. in [place]? --    No data found.  Updated Vital Signs BP 111/79 (BP Location: Right Arm)    Pulse 71    Temp 98.9 F (37.2 C) (Oral)    Resp 16    LMP 04/26/2021 (Exact Date)    SpO2 99%   Visual Acuity Right Eye Distance:   Left Eye Distance:   Bilateral Distance:    Right Eye Near:   Left Eye Near:    Bilateral Near:     Physical Exam Vitals reviewed.  HENT:     Head: Normocephalic.     Right Ear: Tympanic membrane normal.     Left Ear: Tympanic membrane normal.     Mouth/Throat:     Mouth: Mucous membranes are moist.  Eyes:     Conjunctiva/sclera: Conjunctivae normal.  Cardiovascular:     Rate and Rhythm: Normal rate and regular rhythm.  Pulmonary:     Effort: Pulmonary effort is normal.  Abdominal:     Palpations: Abdomen is soft.  Musculoskeletal:     Cervical back: Normal range of motion.  Skin:    General: Skin is warm.  Neurological:     General: No focal deficit present.     Mental Status: She is alert.  Psychiatric:        Mood and Affect: Mood normal.     UC Treatments / Results  Labs (all labs ordered are listed, but only  abnormal results are displayed) Labs Reviewed - No data to display  EKG   Radiology No results found.  Procedures Procedures (including critical care time)  Medications Ordered in UC Medications - No data to display  Initial Impression / Assessment and Plan / UC Course  I have reviewed the triage vital signs and the nursing notes.  Pertinent labs & imaging results that were available during my care of the patient were reviewed by me and considered in my medical decision making (see chart for details).     MDM:  discussed symptomatic treatment  Final Clinical Impressions(s) / UC Diagnoses   Final diagnoses:  Acute pharyngitis, unspecified etiology     Discharge Instructions      Return if any problems    ED Prescriptions   None    PDMP not reviewed this encounter. An After Visit Summary was printed and given to the patient.    Elson Areas, [place] 04/30/21 1202
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
## 2021-08-18 ENCOUNTER — Ambulatory Visit: Payer: 59 | Admitting: Family Medicine

## 2021-08-18 ENCOUNTER — Encounter: Payer: Self-pay | Admitting: Family Medicine

## 2021-08-18 VITALS — BP 108/73 | HR 77 | Temp 98.3°F | Ht 61.0 in | Wt 95.8 lb

## 2021-08-18 DIAGNOSIS — J020 Streptococcal pharyngitis: Secondary | ICD-10-CM | POA: Insufficient documentation

## 2021-08-18 LAB — POCT RAPID STREP A (OFFICE): Rapid Strep A Screen: POSITIVE — AB

## 2021-08-18 MED ORDER — AZITHROMYCIN 250 MG PO TABS: ORAL_TABLET | ORAL | 0 refills | Status: DC

## 2021-08-18 MED ORDER — ONDANSETRON 4 MG PO TBDP: 4.0000 mg | ORAL_TABLET | Freq: Three times a day (TID) | ORAL | 0 refills | Status: DC | PRN

## 2021-08-18 NOTE — Progress Notes (Signed)
? ?Subjective:  ?Patient ID: Rose Kaufman, female    DOB: [birth year]  Age: 26 y.o. MRN: [id number] ? ?CC: ?Chief Complaint  ?Patient presents with  ? Sore Throat  ?  Fatigue and cold sweats today  ? ? ?HPI: ? ?26 year old female presents for evaluation the above. ? ?Symptoms started yesterday.  She has had sore throat, fatigue, and nausea.  No abdominal pain.  She has had some diarrhea.  No fever.  No relieving factors.  She is most bothered by the sore throat and fatigue.  She is concerned that she has strep pharyngitis. ? ?Patient Active Problem List  ? Diagnosis Date Noted  ? Strep pharyngitis 08/18/2021  ? Scoliosis 01/23/2017  ? Anxiety and depression 02/01/2016  ? Encounter for IUD insertion 01/01/2014  ? Other and unspecified ovarian cyst 01/01/2014  ? ? ?Social Hx   ?Social History  ? ?Socioeconomic History  ? Marital status: Single  ?  Spouse name: Not on file  ? Number of children: Not on file  ? Years of education: Not on file  ? Highest education level: Not on file  ?Occupational History  ? Not on file  ?Tobacco Use  ? Smoking status: Never  ? Smokeless tobacco: Never  ?Vaping Use  ? Vaping Use: Every day  ? Substances: THC  ?Substance and Sexual Activity  ? Alcohol use: No  ? Drug use: Yes  ?  Types: Other-see comments, Marijuana  ?  Comment: THC  ? Sexual activity: Yes  ?  Birth control/protection: I.U.D.  ?Other Topics Concern  ? Not on file  ?Social History Narrative  ? Not on file  ? ?Social Determinants of Health  ? ?Financial Resource Strain: Not on file  ?Food Insecurity: Not on file  ?Transportation Needs: Not on file  ?Physical Activity: Not on file  ?Stress: Not on file  ?Social Connections: Not on file  ? ? ?Review of Systems ?Per HPI ? ?Objective:  ?BP 108/73   Pulse 77   Temp 98.3 ?F (36.8 ?C) (Oral)   Ht 5\' 1"  (1.549 m)   Wt 95 lb 12.8 oz (43.5 kg)   SpO2 100%   BMI 18.10 kg/m?  ? ? ?  08/18/2021  ?  2:57 PM 04/30/2021  ?  9:17 AM 06/22/2020  ?  9:11 AM  ?BP/Weight  ?Systolic BP  108 06/24/2020 110  ?Diastolic BP 73 79 62  ?Wt. (Lbs) 95.8  95  ?BMI 18.1 kg/m2  17.95 kg/m2  ? ? ?Physical Exam ?Vitals and nursing note reviewed.  ?Constitutional:   ?   General: She is not in acute distress. ?   Appearance: Normal appearance.  ?HENT:  ?   Head: Normocephalic and atraumatic.  ?   Right Ear: Tympanic membrane normal.  ?   Left Ear: Tympanic membrane normal.  ?   Mouth/Throat:  ?   Pharynx: Posterior oropharyngeal erythema present. No oropharyngeal exudate.  ?Eyes:  ?   General:     ?   Right eye: No discharge.     ?   Left eye: No discharge.  ?   Conjunctiva/sclera: Conjunctivae normal.  ?Cardiovascular:  ?   Rate and Rhythm: Normal rate and regular rhythm.  ?Pulmonary:  ?   Effort: Pulmonary effort is normal.  ?   Breath sounds: Normal breath sounds.  ?Abdominal:  ?   General: There is no distension.  ?   Palpations: Abdomen is soft.  ?   Tenderness: There is no abdominal tenderness.  ?  Neurological:  ?   Mental Status: She is alert.  ?Psychiatric:     ?   Mood and Affect: Mood normal.     ?   Behavior: Behavior normal.  ? ? ?Lab Results  ?Component Value Date  ? WBC 8.9 06/22/2020  ? HGB 13.5 06/22/2020  ? HCT 41.1 06/22/2020  ? PLT 344 06/22/2020  ? GLUCOSE 64 (L) 06/22/2020  ? ALT 9 06/22/2020  ? AST 16 06/22/2020  ? NA 142 06/22/2020  ? K 4.2 06/22/2020  ? CL 104 06/22/2020  ? CREATININE 0.65 06/22/2020  ? BUN 9 06/22/2020  ? CO2 25 06/22/2020  ? TSH 1.382 10/02/2010  ? ? ? ?Assessment & Plan:  ? ?Problem List Items Addressed This Visit   ? ?  ? Respiratory  ? Strep pharyngitis - Primary  ?  Rapid strep positive.  Treating with azithromycin given allergy to penicillin. ?  ?  ? Relevant Medications  ? azithromycin (ZITHROMAX) 250 MG tablet  ? Other Relevant Orders  ? POCT rapid strep A (Completed)  ? ? ?Meds ordered this encounter  ?Medications  ? ondansetron (ZOFRAN-ODT) 4 MG disintegrating tablet  ?  Sig: Take 1 tablet (4 mg total) by mouth every 8 (eight) hours as needed for nausea or vomiting.  ?   Dispense:  20 tablet  ?  Refill:  0  ? azithromycin (ZITHROMAX) 250 MG tablet  ?  Sig: 2 tablets on day 1, then 1 tablet daily on days 2-5.  ?  Dispense:  6 tablet  ?  Refill:  0  ? ?Everlene Other DO ?[facility] ? ?

## 2021-08-18 NOTE — Assessment & Plan Note (Signed)
Rapid strep positive.  Treating with azithromycin given allergy to penicillin. ?

## 2021-08-18 NOTE — Patient Instructions (Addendum)
Rest. Lots of fluids.  ? ?Medication as directed. ? ?Take care ? ?Dr. Adriana Simas ?

## 2021-10-14 ENCOUNTER — Ambulatory Visit
Admission: EM | Admit: 2021-10-14 | Discharge: 2021-10-14 | Disposition: A | Discharge status: Home / Self Care | Payer: 59 | Attending: Family Medicine | Admitting: Family Medicine

## 2021-10-14 DIAGNOSIS — J029 Acute pharyngitis, unspecified: Secondary | ICD-10-CM | POA: Diagnosis present

## 2021-10-14 DIAGNOSIS — R5383 Other fatigue: Secondary | ICD-10-CM | POA: Diagnosis not present

## 2021-10-14 LAB — POCT RAPID STREP A (OFFICE): Rapid Strep A Screen: NEGATIVE

## 2021-10-14 NOTE — ED Provider Notes (Signed)
[facility]    CSN: [id number] Arrival date & time: 10/14/21  0847      History   Chief Complaint Chief Complaint  Patient presents with   Sore Throat   Diarrhea   Fatigue         HPI Rose Kaufman is a 26 y.o. female.   Patient presenting today with 1 day history of scratchy throat, fatigue, chills, decreased appetite, diarrhea off-and-on.  Denies fever, ingestion, cough, abdominal pain, nausea, vomiting.  Not trying anything over-the-counter for symptoms thus far.  States she had strep multiple times already this year and has felt similar so wanted to be checked for this.    Past Medical History:  Diagnosis Date   IUD (intrauterine device) in place 01/01/2014   Had inserted July 2013   Other and unspecified ovarian cyst 01/01/2014   Has left ovarian cyst,3.56 x 3.01 cm   Scoliosis    Unspecified symptom associated with female genital organs 01/01/2014    Patient Active Problem List   Diagnosis Date Noted   Strep pharyngitis 08/18/2021   Scoliosis 01/23/2017   Anxiety and depression 02/01/2016   Encounter for IUD insertion 01/01/2014   Other and unspecified ovarian cyst 01/01/2014    Past Surgical History:  Procedure Laterality Date   back surgery for scoliosisi     WISDOM TOOTH EXTRACTION  2015    OB History     Gravida  0   Para      Term      Preterm      AB      Living         SAB      IAB      Ectopic      Multiple      Live Births               Home Medications    Prior to Admission medications   Medication Sig Start Date End Date Taking? Authorizing Provider  PARAGARD INTRAUTERINE COPPER IU by Intrauterine route.   Yes [provider]  azithromycin (ZITHROMAX) 250 MG tablet 2 tablets on day 1, then 1 tablet daily on days 2-5. 08/18/21   Tommie Sams, DO  ondansetron (ZOFRAN-ODT) 4 MG disintegrating tablet Take 1 tablet (4 mg total) by mouth every 8 (eight) hours as needed for nausea or vomiting.  08/18/21   Tommie Sams, DO    Family History Family History  Problem Relation Age of Onset   Bipolar disorder Father     Social History Social History   Tobacco Use   Smoking status: Never   Smokeless tobacco: Never  Vaping Use   Vaping Use: Every day   Substances: THC  Substance Use Topics   Alcohol use: No   Drug use: Yes    Types: Other-see comments, Marijuana    Comment: THC     Allergies   Latuda [lurasidone hcl], Viibryd [vilazodone hcl], Morphine and related, Demerol, Fentanyl, and Penicillins   Review of Systems Review of Systems Per HPI  Physical Exam Triage Vital Signs ED Triage Vitals [10/14/21 0917]  Enc Vitals Group     BP      Pulse      Resp      Temp      Temp src      SpO2      Weight      Height      Head Circumference      Peak  Flow      Pain Score 0     Pain Loc      Pain Edu?      Excl. in [place]?    No data found.  Updated Vital Signs There were no vitals taken for this visit.  Visual Acuity Right Eye Distance:   Left Eye Distance:   Bilateral Distance:    Right Eye Near:   Left Eye Near:    Bilateral Near:     Physical Exam Vitals and nursing note reviewed.  Constitutional:      Appearance: Normal appearance. She is not ill-appearing.  HENT:     Head: Atraumatic.     Nose: Nose normal.     Mouth/Throat:     Mouth: Mucous membranes are moist.     Pharynx: Posterior oropharyngeal erythema present. No oropharyngeal exudate.  Eyes:     Extraocular Movements: Extraocular movements intact.     Conjunctiva/sclera: Conjunctivae normal.  Cardiovascular:     Rate and Rhythm: Normal rate and regular rhythm.     Heart sounds: Normal heart sounds.  Pulmonary:     Effort: Pulmonary effort is normal.     Breath sounds: Normal breath sounds.  Musculoskeletal:        General: Normal range of motion.     Cervical back: Normal range of motion and neck supple.  Lymphadenopathy:     Cervical: No cervical adenopathy.  Skin:     General: Skin is warm and dry.  Neurological:     Mental Status: She is alert and oriented to person, place, and time.  Psychiatric:        Mood and Affect: Mood normal.        Thought Content: Thought content normal.        Judgment: Judgment normal.      UC Treatments / Results  Labs (all labs ordered are listed, but only abnormal results are displayed) Labs Reviewed  CULTURE, GROUP A STREP ([place])  COVID-19, FLU A+B NAA  POCT RAPID STREP A (OFFICE)    EKG   Radiology No results found.  Procedures Procedures (including critical care time)  Medications Ordered in UC Medications - No data to display  Initial Impression / Assessment and Plan / UC Course  I have reviewed the triage vital signs and the nursing notes.  Pertinent labs & imaging results that were available during my care of the patient were reviewed by me and considered in my medical decision making (see chart for details).     Rapid strep negative, throat culture and COVID and flu testing pending.  Suspect viral illness.  Discussed supportive care, return precautions.  Work note given.  Final Clinical Impressions(s) / UC Diagnoses   Final diagnoses:  Sore throat  Other fatigue   Discharge Instructions   None    ED Prescriptions   None    PDMP not reviewed this encounter.   Volney American, [place] 10/14/21 1044

## 2021-10-14 NOTE — ED Triage Notes (Signed)
Pt reports scratchy throat, fatigue, chills, low appetite and diarrhea x 1 day.   Opt is concern for Strep.

## 2021-10-15 LAB — COVID-19, FLU A+B NAA
Influenza A, NAA: NOT DETECTED
Influenza B, NAA: NOT DETECTED
SARS-CoV-2, NAA: NOT DETECTED

## 2021-10-17 LAB — CULTURE, GROUP A STREP (THRC)

## 2022-02-16 ENCOUNTER — Ambulatory Visit: Payer: 59 | Admitting: Family Medicine

## 2022-02-16 VITALS — BP 106/70 | HR 83 | Temp 98.0°F | Ht 61.0 in | Wt 101.0 lb

## 2022-02-16 DIAGNOSIS — J984 Other disorders of lung: Secondary | ICD-10-CM | POA: Diagnosis not present

## 2022-02-16 MED ORDER — ALBUTEROL SULFATE HFA 108 (90 BASE) MCG/ACT IN AERS: 2.0000 | INHALATION_SPRAY | Freq: Four times a day (QID) | RESPIRATORY_TRACT | 0 refills | Status: DC | PRN

## 2022-02-16 MED ORDER — PREDNISONE 50 MG PO TABS: ORAL_TABLET | ORAL | 0 refills | Status: DC

## 2022-02-16 NOTE — Progress Notes (Signed)
Subjective:  Patient ID: Rose Kaufman, female    DOB: 1995/09/13  Age: 26 y.o. MRN: [id number]  CC: Chief Complaint  Patient presents with   Cough    X 4 to 5 days Mainly dry cough, feels like asthma -wheezy at night - vapes regularly and believes she may have burnt lungs with inhalation friday, has tingling in legs when stands up     HPI: 26 year old female presents for evaluation of the above.  Symptom started Friday. Occurred after use her vape. States that it was on a higher/hotter setting. She believes that this has irritated her airway. Reports dry cough and wheezing. No fever. No other respiratory symptoms. No other complaints.   Patient Active Problem List   Diagnosis Date Noted   Pneumonitis 02/16/2022   Scoliosis 01/23/2017   Anxiety and depression 02/01/2016   Encounter for IUD insertion 01/01/2014   Other and unspecified ovarian cyst 01/01/2014    Social Hx   Social History   Socioeconomic History   Marital status: Single    Spouse name: Not on file   Number of children: Not on file   Years of education: Not on file   Highest education level: Not on file  Occupational History   Not on file  Tobacco Use   Smoking status: Never   Smokeless tobacco: Never  Vaping Use   Vaping Use: Every day   Substances: THC  Substance and Sexual Activity   Alcohol use: No   Drug use: Yes    Types: Other-see comments, Marijuana    Comment: THC   Sexual activity: Yes    Birth control/protection: I.U.D.  Other Topics Concern   Not on file  Social History Narrative   Not on file   Social Determinants of Health   Financial Resource Strain: Medium Risk (10/16/2019)   Overall Financial Resource Strain (CARDIA)    Difficulty of Paying Living Expenses: Somewhat hard  Food Insecurity: No Food Insecurity (10/16/2019)   Hunger Vital Sign    Worried About Running Out of Food in the Last Year: Never true    Ran Out of Food in the Last Year: Never true  Transportation  Needs: No Transportation Needs (10/16/2019)   PRAPARE - Administrator, Civil Service (Medical): No    Lack of Transportation (Non-Medical): No  Physical Activity: Inactive (10/16/2019)   Exercise Vital Sign    Days of Exercise per Week: 0 days    Minutes of Exercise per Session: 0 min  Stress: Stress Concern Present (10/16/2019)   [employer] of Occupational Health - Occupational Stress Questionnaire    Feeling of Stress : Rather much  Social Connections: Socially Isolated (10/16/2019)   Social Connection and Isolation Panel [NHANES]    Frequency of Communication with Friends and Family: Once a week    Frequency of Social Gatherings with Friends and Family: Once a week    Attends Religious Services: Never    Diplomatic Services operational officer: No    Attends Engineer, structural: Never    Marital Status: Married    Review of Systems Per HPI  Objective:  BP 106/70   Pulse 83   Temp 98 F (36.7 C)   Ht 5\' 1"  (1.549 m)   Wt 101 lb (45.8 kg)   SpO2 97%   BMI 19.08 kg/m      02/16/2022   11:08 AM 08/18/2021    2:57 PM 04/30/2021    9:17 AM  BP/Weight  Systolic BP 456 256 389  Diastolic BP 70 73 79  Wt. (Lbs) 101 95.8   BMI 19.08 kg/m2 18.1 kg/m2     Physical Exam Vitals and nursing note reviewed.  Constitutional:      General: She is not in acute distress.    Appearance: Normal appearance.  HENT:     Head: Normocephalic and atraumatic.     Mouth/Throat:     Pharynx: Oropharynx is clear.  Eyes:     General:        Right eye: No discharge.     Conjunctiva/sclera: Conjunctivae normal.  Cardiovascular:     Rate and Rhythm: Normal rate and regular rhythm.  Pulmonary:     Effort: Pulmonary effort is normal.     Breath sounds: Normal breath sounds. No wheezing or rales.  Neurological:     Mental Status: She is alert.  Psychiatric:        Mood and Affect: Mood normal.        Behavior: Behavior normal.     Lab Results  Component Value  Date   WBC 8.9 06/22/2020   HGB 13.5 06/22/2020   HCT 41.1 06/22/2020   PLT 344 06/22/2020   GLUCOSE 64 (L) 06/22/2020   ALT 9 06/22/2020   AST 16 06/22/2020   NA 142 06/22/2020   K 4.2 06/22/2020   CL 104 06/22/2020   CREATININE 0.65 06/22/2020   BUN 9 06/22/2020   CO2 25 06/22/2020   TSH 1.382 10/02/2010     Assessment & Plan:   Problem List Items Addressed This Visit       Respiratory   Pneumonitis - Primary    From vape use. Treating with prednisone and albuterol.        Meds ordered this encounter  Medications   predniSONE (DELTASONE) 50 MG tablet    Sig: 1 tablet daily x 5 days    Dispense:  5 tablet    Refill:  0   albuterol (VENTOLIN HFA) 108 (90 Base) MCG/ACT inhaler    Sig: Inhale 2 puffs into the lungs every 6 (six) hours as needed for wheezing or shortness of breath.    Dispense:  18 g    Refill:  0    Follow-up:  PRN  Rhea

## 2022-02-16 NOTE — Patient Instructions (Signed)
Medications as prescribed.  Call with concerns.  I would recommend stopping use of the Vape.  Take care  Dr. Lacinda Axon

## 2022-02-16 NOTE — Assessment & Plan Note (Signed)
From vape use. Treating with prednisone and albuterol.

## 2022-02-17 ENCOUNTER — Ambulatory Visit: Payer: 59 | Admitting: Family Medicine

## 2022-03-11 ENCOUNTER — Other Ambulatory Visit: Payer: Self-pay | Admitting: Family Medicine

## 2022-10-31 ENCOUNTER — Encounter: Payer: Self-pay | Admitting: Nurse Practitioner

## 2022-10-31 ENCOUNTER — Ambulatory Visit: Payer: 59 | Admitting: Nurse Practitioner

## 2022-10-31 VITALS — BP 106/75 | HR 73 | Wt 112.0 lb

## 2022-10-31 DIAGNOSIS — J358 Other chronic diseases of tonsils and adenoids: Secondary | ICD-10-CM | POA: Diagnosis not present

## 2022-10-31 DIAGNOSIS — J020 Streptococcal pharyngitis: Secondary | ICD-10-CM | POA: Diagnosis not present

## 2022-10-31 NOTE — Patient Instructions (Signed)
[facility] [phone].

## 2022-10-31 NOTE — Progress Notes (Signed)
   Subjective:    Patient ID: Rose Kaufman, female    DOB: 1996/02/28, 27 y.o.   MRN: [id number]  HPI Presents to request an ENT referral for recurrent strep pharyngitis, 3 episodes over the past year.  No fever.  After 1 of these episodes patient began developing small tonsillar stones on the left side.  Has been able to express material.  Began about 2 weeks ago.  No difficulty swallowing.  Stinging in the throat at times.  States it feels "uncomfortable" behind the left tonsil.   Review of Systems  Constitutional:  Negative for fever.  HENT:  Negative for sore throat and trouble swallowing.   Respiratory:  Negative for cough, chest tightness, shortness of breath and wheezing.   Cardiovascular:  Negative for chest pain.       Objective:   Physical Exam NAD.  Alert, oriented.  TMs minimal clear effusion, no erythema.  Pharynx clear and moist.  Tonsils 1+ in size.  Left tonsillar area is has several tiny white cryptic areas.  No exudate.  Lungs clear.  Heart regular rate rhythm. Today's Vitals   10/31/22 0939  BP: 106/75  Pulse: 73  SpO2: 97%  Weight: 112 lb (50.8 kg)   Body mass index is 21.16 kg/m.        Assessment & Plan:   Problem List Items Addressed This Visit       Respiratory   Cryptic tonsil   Relevant Orders   Ambulatory referral to ENT   Recurrent streptococcal pharyngitis - Primary   Relevant Orders   Ambulatory referral to ENT   Refer to ENT specialist per patient request.  Call back in the meantime if worsening symptoms.

## 2022-12-14 NOTE — Progress Notes (Signed)
This encounter was created in error - please disregard.

## 2023-11-30 ENCOUNTER — Ambulatory Visit: Payer: Self-pay

## 2023-11-30 NOTE — Telephone Encounter (Signed)
 FYI Only or Action Required?: FYI only for provider.  Patient was last seen in primary care on 10/31/2022 by Mauro Elveria BROCKS, NP.  Called Nurse Triage reporting Breathing Problem.  Symptoms began a week ago.  Interventions attempted: Prescription medications: Adderall.  Symptoms are: chest tensing up, breathing abnormality (has to push or exert the breath out), cough, dizziness unchanged.  Triage Disposition: Go to ED Now (Notify PCP)  Patient/caregiver understands and will follow disposition?: Yes             Copied from CRM [id number]. Topic: Clinical - Red Word Triage >> Nov 30, 2023  1:58 PM Antwanette L wrote: Red Word that prompted transfer to Nurse Triage: patient is having some ticks and sob( cannot breathe normal). Torso is tighten whenever breathing out. Reason for Disposition  [1] MODERATE difficulty breathing (e.g., speaks in phrases, SOB even at rest, pulse 100-120) AND [2] NEW-onset or WORSE than normal  Answer Assessment - Initial Assessment Questions Patient states she thought it could have something to do with taking Adderall, which she has been on for almost a year. Patient states she also still vapes. She was seen in 2023 for pneumonitis but she states this does not feel like that.   1. RESPIRATORY STATUS: Describe your breathing? (e.g., wheezing, shortness of breath, unable to speak, severe coughing)      Patient states her body wants to tighten up and that is affecting the way I breath. She states it feels like  tick that she can't make her body stop. She states when she is breathing it feels like she has to force the rest of the air out when she exhales.   2. ONSET: When did this breathing problem begin?      X 1 week.  3. PATTERN Does the difficult breathing come and go, or has it been constant since it started?      Comes and goes throughout the day and worse at night.  4. SEVERITY: How bad is your breathing? (e.g., mild, moderate,  severe)      She states it feels like a 7/10, huffing when I breath out. Denies any wheezing. Patient speaking in full sentences.  5. RECURRENT SYMPTOM: Have you had difficulty breathing before? If Yes, ask: When was the last time? and What happened that time?      No.  6. CARDIAC HISTORY: Do you have any history of heart disease? (e.g., heart attack, angina, bypass surgery, angioplasty)      No.  7. LUNG HISTORY: Do you have any history of lung disease?  (e.g., pulmonary embolus, asthma, emphysema)     No.  8. CAUSE: What do you think is causing the breathing problem?      Unsure.  9. OTHER SYMPTOMS: Do you have any other symptoms? (e.g., chest pain, cough, dizziness, fever, runny nose)     Patient denies any chest pain at this time but states she does have some chest pain after she has been tensing up, cough/clearing her throat, dizziness when it starts happening real bad and I can't stop long enough to get a breath.  10. O2 SATURATION MONITOR:  Do you use an oxygen saturation monitor (pulse oximeter) at home? If Yes, ask: What is your reading (oxygen level) today? What is your usual oxygen saturation reading? (e.g., 95%)       100%.  11. PREGNANCY: Is there any chance you are pregnant? When was your last menstrual period?       LMP:  Today.  12. TRAVEL: Have you traveled out of the country in the last month? (e.g., travel history, exposures)       No.  Protocols used: Breathing Difficulty-A-AH

## 2023-11-30 NOTE — Telephone Encounter (Signed)
 noted
